# Patient Record
Sex: Female | Born: 1945 | Race: White | Hispanic: No | Smoking: Never smoker
Health system: Southern US, Community
[De-identification: ages and names within clinical notes are randomized; demographics above are authoritative.]

## PROBLEM LIST (undated history)

## (undated) DIAGNOSIS — D1720 Benign lipomatous neoplasm of skin and subcutaneous tissue of unspecified limb: Secondary | ICD-10-CM

## (undated) DIAGNOSIS — R519 Headache, unspecified: Secondary | ICD-10-CM

## (undated) DIAGNOSIS — Z9889 Other specified postprocedural states: Secondary | ICD-10-CM

## (undated) DIAGNOSIS — M519 Unspecified thoracic, thoracolumbar and lumbosacral intervertebral disc disorder: Secondary | ICD-10-CM

## (undated) DIAGNOSIS — B029 Zoster without complications: Secondary | ICD-10-CM

## (undated) DIAGNOSIS — R51 Headache: Secondary | ICD-10-CM

## (undated) DIAGNOSIS — I10 Essential (primary) hypertension: Secondary | ICD-10-CM

## (undated) DIAGNOSIS — R112 Nausea with vomiting, unspecified: Secondary | ICD-10-CM

## (undated) HISTORY — PX: BUNIONECTOMY: SHX129

## (undated) HISTORY — PX: OTHER SURGICAL HISTORY: SHX169

## (undated) HISTORY — PX: COLONOSCOPY: SHX174

---

## 2005-01-01 ENCOUNTER — Ambulatory Visit: Payer: Self-pay | Admitting: Obstetrics and Gynecology

## 2005-07-07 ENCOUNTER — Ambulatory Visit: Payer: Self-pay | Admitting: Unknown Physician Specialty

## 2006-01-13 ENCOUNTER — Ambulatory Visit: Payer: Self-pay | Admitting: Obstetrics and Gynecology

## 2006-05-16 ENCOUNTER — Emergency Department: Payer: Self-pay | Admitting: Emergency Medicine

## 2007-02-06 ENCOUNTER — Emergency Department: Payer: Self-pay | Admitting: Emergency Medicine

## 2007-02-06 ENCOUNTER — Other Ambulatory Visit: Payer: Self-pay

## 2007-02-08 ENCOUNTER — Ambulatory Visit: Payer: Self-pay | Admitting: Family Medicine

## 2007-03-23 ENCOUNTER — Ambulatory Visit: Payer: Self-pay | Admitting: Obstetrics and Gynecology

## 2008-04-06 ENCOUNTER — Ambulatory Visit: Payer: Self-pay | Admitting: Obstetrics and Gynecology

## 2008-06-22 ENCOUNTER — Ambulatory Visit: Payer: Self-pay | Admitting: Internal Medicine

## 2009-05-02 ENCOUNTER — Ambulatory Visit: Payer: Self-pay | Admitting: Obstetrics and Gynecology

## 2010-05-13 ENCOUNTER — Ambulatory Visit: Payer: Self-pay | Admitting: Obstetrics and Gynecology

## 2011-05-16 ENCOUNTER — Ambulatory Visit: Payer: Self-pay | Admitting: Obstetrics and Gynecology

## 2011-09-15 ENCOUNTER — Emergency Department: Payer: Self-pay | Admitting: Unknown Physician Specialty

## 2012-05-17 ENCOUNTER — Ambulatory Visit: Payer: Self-pay | Admitting: Obstetrics and Gynecology

## 2013-05-18 ENCOUNTER — Ambulatory Visit: Payer: Self-pay | Admitting: Obstetrics and Gynecology

## 2013-10-17 DIAGNOSIS — L570 Actinic keratosis: Secondary | ICD-10-CM

## 2013-10-17 HISTORY — DX: Actinic keratosis: L57.0

## 2014-05-23 ENCOUNTER — Ambulatory Visit: Payer: Self-pay

## 2014-09-20 ENCOUNTER — Ambulatory Visit: Payer: Self-pay

## 2014-11-20 ENCOUNTER — Ambulatory Visit: Payer: Self-pay | Admitting: Surgery

## 2014-11-20 DIAGNOSIS — I10 Essential (primary) hypertension: Secondary | ICD-10-CM

## 2014-11-20 LAB — CBC WITH DIFFERENTIAL/PLATELET
Basophil #: 0 10*3/uL (ref 0.0–0.1)
Basophil %: 0.4 %
EOS PCT: 1.1 %
Eosinophil #: 0.1 10*3/uL (ref 0.0–0.7)
HCT: 44 % (ref 35.0–47.0)
HGB: 14.1 g/dL (ref 12.0–16.0)
LYMPHS ABS: 1.4 10*3/uL (ref 1.0–3.6)
LYMPHS PCT: 20.4 %
MCH: 29.5 pg (ref 26.0–34.0)
MCHC: 32.1 g/dL (ref 32.0–36.0)
MCV: 92 fL (ref 80–100)
Monocyte #: 0.5 x10 3/mm (ref 0.2–0.9)
Monocyte %: 7.2 %
Neutrophil #: 4.7 10*3/uL (ref 1.4–6.5)
Neutrophil %: 70.9 %
PLATELETS: 239 10*3/uL (ref 150–440)
RBC: 4.79 10*6/uL (ref 3.80–5.20)
RDW: 14 % (ref 11.5–14.5)
WBC: 6.7 10*3/uL (ref 3.6–11.0)

## 2014-11-20 LAB — COMPREHENSIVE METABOLIC PANEL
ALBUMIN: 4 g/dL (ref 3.4–5.0)
ALK PHOS: 103 U/L
ANION GAP: 5 — AB (ref 7–16)
BILIRUBIN TOTAL: 0.5 mg/dL (ref 0.2–1.0)
BUN: 17 mg/dL (ref 7–18)
CREATININE: 1.05 mg/dL (ref 0.60–1.30)
Calcium, Total: 8.9 mg/dL (ref 8.5–10.1)
Chloride: 105 mmol/L (ref 98–107)
Co2: 28 mmol/L (ref 21–32)
EGFR (African American): >60
EGFR (Non-African Amer.): 56 — ABNORMAL LOW
Glucose: 99 mg/dL (ref 65–99)
OSMOLALITY: 277 (ref 275–301)
POTASSIUM: 3.9 mmol/L (ref 3.5–5.1)
SGOT(AST): 25 U/L (ref 15–37)
SGPT (ALT): 37 U/L
Sodium: 138 mmol/L (ref 136–145)
Total Protein: 7.3 g/dL (ref 6.4–8.2)

## 2014-11-28 ENCOUNTER — Ambulatory Visit: Payer: Self-pay | Admitting: Surgery

## 2015-03-12 LAB — SURGICAL PATHOLOGY

## 2015-03-18 NOTE — Op Note (Signed)
PATIENT NAME:  Ashley Parrish, Ashley Parrish MR#:  [id number] DATE OF BIRTH:  01/25/1946  DATE OF PROCEDURE:  11/28/2014  PREOPERATIVE DIAGNOSIS: Intramuscular lipoma of left thigh.   POSTOPERATIVE DIAGNOSIS: Intramuscular lipoma of left thigh.   PROCEDURE: Excision of mass of left thigh.   SURGEON: Rochel Brome, MD  ANESTHESIA: General.   INDICATIONS: This 69 year old female has approximately a 7 year history of a mass of the left thigh, which has gradually been increasing in size. Recent MRI demonstrated increased size. It was intramuscular and palpable and surgery was recommended for definitive treatment.   DESCRIPTION OF PROCEDURE: The patient was placed on the operating table in the supine position under general anesthesia. The left leg was externally rotated and flexed slightly at the hip and knee and padded with a pillow. The palpable mass of the upper medial aspect of the thigh had been marked with a surgical marker. The site was prepared with ChloraPrep and draped in a sterile manner.   A longitudinally oriented incision was made in the medial aspect of the left upper thigh some 7 cm in length, carried down through subcutaneous tissues. Numerous small bleeding points were cauterized. Superficial fascia was incised. Dissection was carried down to the deep fascia which was incised longitudinally along the course of the incision and demonstrated an intramuscular lipoma. This did have a smooth plane of dissection and was dissected with blunt and sharp dissection. There were a number of vascular pedicles which were suture ligated with 3-0 Vicryl and with somewhat tedious dissection the mass was completely excised. The ex vivo measurement was 7 x 4 x 1.5 cm and was placed in formalin for routine pathology. The wound was inspected. One additional bleeding point was suture ligated with 3-0 Vicryl. Several small bleeding points were cauterized. Hemostasis was subsequently intact. Next, the deep fascia was closed  with interrupted 2-0 Vicryl sutures. Superficial fascia was closed with interrupted 2-0 Vicryl. The subcutaneous tissues were infiltrated with 0.5% Sensorcaine with epinephrine. The skin was closed with a running 4-0 Monocryl subcuticular suture and LiquiBand.  The patient appeared to tolerate the procedure satisfactorily and was prepared for transfer to the recovery room. ____________________________ Lenna Sciara. Rochel Brome, MD jws:sb D: 11/28/2014 10:22:44 ET T: 11/28/2014 10:29:11 ET JOB#: [id number]  cc: Loreli Dollar, MD, <Dictator> Loreli Dollar MD ELECTRONICALLY SIGNED 12/02/2014 10:42

## 2015-06-05 ENCOUNTER — Other Ambulatory Visit: Payer: Self-pay | Admitting: Obstetrics and Gynecology

## 2015-06-05 DIAGNOSIS — Z1231 Encounter for screening mammogram for malignant neoplasm of breast: Secondary | ICD-10-CM

## 2015-06-08 ENCOUNTER — Other Ambulatory Visit: Payer: Self-pay | Admitting: Obstetrics and Gynecology

## 2015-06-08 ENCOUNTER — Ambulatory Visit
Admission: RE | Admit: 2015-06-08 | Discharge: 2015-06-08 | Disposition: A | Discharge status: Home / Self Care | Payer: Medicare Other | Source: Ambulatory Visit | Attending: Obstetrics and Gynecology | Admitting: Obstetrics and Gynecology

## 2015-06-08 DIAGNOSIS — Z1231 Encounter for screening mammogram for malignant neoplasm of breast: Secondary | ICD-10-CM | POA: Diagnosis not present

## 2015-06-22 ENCOUNTER — Encounter: Payer: Self-pay | Admitting: *Deleted

## 2015-06-25 ENCOUNTER — Encounter: Payer: Self-pay | Admitting: Anesthesiology

## 2015-06-25 ENCOUNTER — Ambulatory Visit: Payer: Medicare Other | Admitting: Anesthesiology

## 2015-06-25 ENCOUNTER — Encounter
Admission: RE | Disposition: A | Discharge status: Home / Self Care | Payer: Self-pay | Source: Ambulatory Visit | Attending: Unknown Physician Specialty

## 2015-06-25 ENCOUNTER — Ambulatory Visit
Admission: RE | Admit: 2015-06-25 | Discharge: 2015-06-25 | Disposition: A | Discharge status: Home / Self Care | Payer: Medicare Other | Source: Ambulatory Visit | Attending: Unknown Physician Specialty | Admitting: Unknown Physician Specialty

## 2015-06-25 DIAGNOSIS — I2510 Atherosclerotic heart disease of native coronary artery without angina pectoris: Secondary | ICD-10-CM | POA: Diagnosis not present

## 2015-06-25 DIAGNOSIS — Z1211 Encounter for screening for malignant neoplasm of colon: Secondary | ICD-10-CM | POA: Insufficient documentation

## 2015-06-25 DIAGNOSIS — I252 Old myocardial infarction: Secondary | ICD-10-CM | POA: Insufficient documentation

## 2015-06-25 DIAGNOSIS — D123 Benign neoplasm of transverse colon: Secondary | ICD-10-CM | POA: Diagnosis not present

## 2015-06-25 DIAGNOSIS — K648 Other hemorrhoids: Secondary | ICD-10-CM | POA: Insufficient documentation

## 2015-06-25 DIAGNOSIS — I10 Essential (primary) hypertension: Secondary | ICD-10-CM | POA: Diagnosis not present

## 2015-06-25 HISTORY — DX: Benign lipomatous neoplasm of skin and subcutaneous tissue of unspecified limb: D17.20

## 2015-06-25 HISTORY — DX: Unspecified thoracic, thoracolumbar and lumbosacral intervertebral disc disorder: M51.9

## 2015-06-25 HISTORY — DX: Nausea with vomiting, unspecified: R11.2

## 2015-06-25 HISTORY — DX: Other specified postprocedural states: Z98.890

## 2015-06-25 HISTORY — DX: Headache: R51

## 2015-06-25 HISTORY — DX: Headache, unspecified: R51.9

## 2015-06-25 HISTORY — PX: COLONOSCOPY WITH PROPOFOL: SHX5780

## 2015-06-25 HISTORY — DX: Essential (primary) hypertension: I10

## 2015-06-25 HISTORY — DX: Zoster without complications: B02.9

## 2015-06-25 SURGERY — COLONOSCOPY WITH PROPOFOL
Anesthesia: General

## 2015-06-25 MED ORDER — PROPOFOL INFUSION 10 MG/ML OPTIME
INTRAVENOUS | Status: DC | PRN
  Administered 2015-06-25: 100 ug/kg/min via INTRAVENOUS

## 2015-06-25 MED ORDER — EPHEDRINE SULFATE 50 MG/ML IJ SOLN
INTRAMUSCULAR | Status: DC | PRN
  Administered 2015-06-25: 10 mg via INTRAVENOUS

## 2015-06-25 MED ORDER — ONDANSETRON HCL 4 MG/2ML IJ SOLN
INTRAMUSCULAR | Status: DC | PRN
  Administered 2015-06-25: 4 mg via INTRAVENOUS

## 2015-06-25 MED ORDER — SODIUM CHLORIDE 0.9 % IV SOLN
INTRAVENOUS | Status: DC
  Administered 2015-06-25: 1000 mL via INTRAVENOUS

## 2015-06-25 MED ORDER — GLYCOPYRROLATE 0.2 MG/ML IJ SOLN
INTRAMUSCULAR | Status: DC | PRN
  Administered 2015-06-25: 0.2 mg via INTRAVENOUS

## 2015-06-25 MED ORDER — FENTANYL CITRATE (PF) 100 MCG/2ML IJ SOLN
INTRAMUSCULAR | Status: DC | PRN
  Administered 2015-06-25: 50 ug via INTRAVENOUS

## 2015-06-25 MED ORDER — PROPOFOL 10 MG/ML IV BOLUS
INTRAVENOUS | Status: DC | PRN
  Administered 2015-06-25: 10 mg via INTRAVENOUS
  Administered 2015-06-25: 50 mg via INTRAVENOUS

## 2015-06-25 MED ORDER — SODIUM CHLORIDE 0.9 % IV SOLN: INTRAVENOUS | Status: DC

## 2015-06-25 MED ORDER — LIDOCAINE HCL (PF) 2 % IJ SOLN
INTRAMUSCULAR | Status: DC | PRN
  Administered 2015-06-25: 50 mg

## 2015-06-25 NOTE — Anesthesia Postprocedure Evaluation (Signed)
  Anesthesia Post-op Note  Patient: Ashley Parrish  Procedure(s) Performed: Procedure(s): COLONOSCOPY WITH PROPOFOL (N/A)  Anesthesia type:General  Patient location: PACU  Post pain: Pain level controlled  Post assessment: Post-op Vital signs reviewed, Patient's Cardiovascular Status Stable, Respiratory Function Stable, Patent Airway and No signs of Nausea or vomiting  Post vital signs: Reviewed and stable  Last Vitals:  Filed Vitals:   06/25/15 1115  BP: 127/71  Pulse: 67  Temp:   Resp: 19    Level of consciousness: awake, alert  and patient cooperative  Complications: No apparent anesthesia complications

## 2015-06-25 NOTE — H&P (Signed)
Primary Care Physician:  Adrian Prows, MD Primary Gastroenterologist:  Dr. Vira Agar  Pre-Procedure History & Physical: HPI:  Ashley Parrish is a 69 y.o. female is here for an colonoscopy.   Past Medical History  Diagnosis Date  . PONV (postoperative nausea and vomiting)   . Hypertension   . Headache   . Shingles   . Lipoma of thigh     left  . Disc disorder     Past Surgical History  Procedure Laterality Date  . Colonoscopy    . Bunionectomy    . Excision of lipoma      Prior to Admission medications   Medication Sig Start Date End Date Taking? Authorizing Provider  amLODipine (NORVASC) 5 MG tablet Take 5 mg by mouth daily.   Yes Historical Provider, MD  bisoprolol (ZEBETA) 5 MG tablet Take 5 mg by mouth daily.   Yes Historical Provider, MD  bisoprolol-hydrochlorothiazide (ZIAC) 2.5-6.25 MG per tablet Take 1 tablet by mouth daily.   Yes Historical Provider, MD  calcium elemental as carbonate (BARIATRIC TUMS ULTRA) 400 MG tablet Chew 1,000 mg by mouth 3 (three) times daily.   Yes Historical Provider, MD  docusate sodium (COLACE) 250 MG capsule Take 250 mg by mouth daily.   Yes Historical Provider, MD  meclizine (ANTIVERT) 25 MG tablet Take 25 mg by mouth 2 (two) times daily as needed for dizziness.   Yes Historical Provider, MD  omega-3 acid ethyl esters (LOVAZA) 1 G capsule Take by mouth daily.   Yes Historical Provider, MD    Allergies as of 05/17/2015  . (Not on File)    Family History  Problem Relation Age of Onset  . Breast cancer Paternal Aunt 54    History   Social History  . Marital Status: Divorced    Spouse Name: N/A  . Number of Children: N/A  . Years of Education: N/A   Occupational History  . Not on file.   Social History Main Topics  . Smoking status: Never Smoker   . Smokeless tobacco: Never Used  . Alcohol Use: No  . Drug Use: No  . Sexual Activity: Not on file   Other Topics Concern  . Not on file   Social History Narrative     Review of Systems: See HPI, otherwise negative ROS  Physical Exam: BP 125/68 mmHg  Pulse 67  Temp(Src) 97.8 F (36.6 C) (Tympanic)  Resp 16  Ht 5\' 8"  (1.727 m)  Wt 81.647 kg (180 lb)  BMI 27.38 kg/m2  SpO2 99% General:   Alert,  pleasant and cooperative in NAD Head:  Normocephalic and atraumatic. Neck:  Supple; no masses or thyromegaly. Lungs:  Clear throughout to auscultation.    Heart:  Regular rate and rhythm. Abdomen:  Soft, nontender and nondistended. Normal bowel sounds, without guarding, and without rebound.   Neurologic:  Alert and  oriented x4;  grossly normal neurologically.  Impression/Plan: [name] is here for an colonoscopy to be performed for screening colonoscopy  Risks, benefits, limitations, and alternatives regarding  colonoscopy have been reviewed with the patient.  Questions have been answered.  All parties agreeable.   Gaylyn Cheers, MD  06/25/2015, 10:06 AM   Primary Care Physician:  Adrian Prows, MD Primary Gastroenterologist:  Dr. Vira Agar  Pre-Procedure History & Physical: HPI:  Ashley Parrish is a 69 y.o. female is here for an colonoscopy.   Past Medical History  Diagnosis Date  . PONV (postoperative nausea and vomiting)   .  Hypertension   . Headache   . Shingles   . Lipoma of thigh     left  . Disc disorder     Past Surgical History  Procedure Laterality Date  . Colonoscopy    . Bunionectomy    . Excision of lipoma      Prior to Admission medications   Medication Sig Start Date End Date Taking? Authorizing Provider  amLODipine (NORVASC) 5 MG tablet Take 5 mg by mouth daily.   Yes Historical Provider, MD  bisoprolol (ZEBETA) 5 MG tablet Take 5 mg by mouth daily.   Yes Historical Provider, MD  bisoprolol-hydrochlorothiazide (ZIAC) 2.5-6.25 MG per tablet Take 1 tablet by mouth daily.   Yes Historical Provider, MD  calcium elemental as carbonate (BARIATRIC TUMS ULTRA) 400 MG tablet Chew 1,000 mg by mouth 3 (three)  times daily.   Yes Historical Provider, MD  docusate sodium (COLACE) 250 MG capsule Take 250 mg by mouth daily.   Yes Historical Provider, MD  meclizine (ANTIVERT) 25 MG tablet Take 25 mg by mouth 2 (two) times daily as needed for dizziness.   Yes Historical Provider, MD  omega-3 acid ethyl esters (LOVAZA) 1 G capsule Take by mouth daily.   Yes Historical Provider, MD    Allergies as of 05/17/2015  . (Not on File)    Family History  Problem Relation Age of Onset  . Breast cancer Paternal Aunt 9    History   Social History  . Marital Status: Divorced    Spouse Name: N/A  . Number of Children: N/A  . Years of Education: N/A   Occupational History  . Not on file.   Social History Main Topics  . Smoking status: Never Smoker   . Smokeless tobacco: Never Used  . Alcohol Use: No  . Drug Use: No  . Sexual Activity: Not on file   Other Topics Concern  . Not on file   Social History Narrative    Review of Systems: See HPI, otherwise negative ROS  Physical Exam: BP 125/68 mmHg  Pulse 67  Temp(Src) 97.8 F (36.6 C) (Tympanic)  Resp 16  Ht 5\' 8"  (1.727 m)  Wt 81.647 kg (180 lb)  BMI 27.38 kg/m2  SpO2 99% General:   Alert,  pleasant and cooperative in NAD Head:  Normocephalic and atraumatic. Neck:  Supple; no masses or thyromegaly. Lungs:  Clear throughout to auscultation.    Heart:  Regular rate and rhythm. Abdomen:  Soft, nontender and nondistended. Normal bowel sounds, without guarding, and without rebound.   Neurologic:  Alert and  oriented x4;  grossly normal neurologically.  Impression/Plan: Ashley Parrish is here for an colonoscopy to be performed for screening colonoscopy  Risks, benefits, limitations, and alternatives regarding  colonoscopy have been reviewed with the patient.  Questions have been answered.  All parties agreeable.   Gaylyn Cheers, MD  06/25/2015, 10:06 AM

## 2015-06-25 NOTE — Op Note (Signed)
[facility] Gastroenterology Patient Name: Ashley Parrish Procedure Date: 06/25/2015 10:00 AM MRN: [id number] Account #: [id number] Date of Birth: Jan 23, 1946 Admit Type: Outpatient Age: 69 Room: [facility] ENDO ROOM 1 Gender: Female Note Status: Finalized Procedure:         Colonoscopy Indications:       Screening for colorectal malignant neoplasm Providers:         Manya Silvas, MD Referring MD:      Youlanda Roys. Ola Spurr, MD (Referring MD) Medicines:         Propofol per Anesthesia Complications:     No immediate complications. Procedure:         Pre-Anesthesia Assessment:                    - After reviewing the risks and benefits, the patient was                     deemed in satisfactory condition to undergo the procedure.                    After obtaining informed consent, the colonoscope was                     passed under direct vision. Throughout the procedure, the                     patient's blood pressure, pulse, and oxygen saturations                     were monitored continuously. The Colonoscope was                     introduced through the anus and advanced to the the cecum,                     identified by appendiceal orifice and ileocecal valve. The                     colonoscopy was performed without difficulty. The patient                     tolerated the procedure well. The quality of the bowel                     preparation was good. Findings:      A diminutive polyp was found at the splenic flexure. The polyp was       sessile. The polyp was removed with a jumbo cold forceps. Resection and       retrieval were complete.      Internal hemorrhoids were found during endoscopy. The hemorrhoids were       small and Grade I (internal hemorrhoids that do not prolapse).      The exam was otherwise without abnormality. Impression:        - One diminutive polyp at the splenic flexure. Resected                     and retrieved.   - Internal hemorrhoids.                    - The examination was otherwise normal. Recommendation:    - Await pathology results. Manya Silvas, MD 06/25/2015 10:38:10 AM This report has been signed electronically. Number of Addenda: 0 Note Initiated  On: 06/25/2015 10:00 AM Scope Withdrawal Time: 0 hours 13 minutes 13 seconds  Total Procedure Duration: 0 hours 18 minutes 40 seconds       [facility]

## 2015-06-25 NOTE — Anesthesia Preprocedure Evaluation (Signed)
Anesthesia Evaluation  Patient identified by MRN, date of birth, ID band Patient awake    Reviewed: Allergy & Precautions, H&P , NPO status , Patient's Chart, lab work & pertinent test results, reviewed documented beta blocker date and time   History of Anesthesia Complications (+) PONV and history of anesthetic complications  Airway Mallampati: II  TM Distance: >3 FB Neck ROM: full    Dental no notable dental hx.    Pulmonary neg pulmonary ROS,  breath sounds clear to auscultation  Pulmonary exam normal       Cardiovascular Exercise Tolerance: Good hypertension, On Home Beta Blockers and On Medications - CAD, - Past MI and - CABG Normal cardiovascular exam- Valvular Problems/MurmursRhythm:regular Rate:Normal     Neuro/Psych  Headaches, negative psych ROS   GI/Hepatic negative GI ROS, Neg liver ROS,   Endo/Other  negative endocrine ROS  Renal/GU negative Renal ROS  negative genitourinary   Musculoskeletal   Abdominal   Peds  Hematology negative hematology ROS (+)   Anesthesia Other Findings Past Medical History:   PONV (postoperative nausea and vomiting)                     Hypertension                                                 Headache                                                     Shingles                                                     Lipoma of thigh                                                Comment:left   Disc disorder                                                Reproductive/Obstetrics negative OB ROS                             Anesthesia Physical Anesthesia Plan  ASA: II  Anesthesia Plan: General   Post-op Pain Management:    Induction:   Airway Management Planned:   Additional Equipment:   Intra-op Plan:   Post-operative Plan:   Informed Consent: I have reviewed the patients History and Physical, chart, labs and discussed the procedure  including the risks, benefits and alternatives for the proposed anesthesia with the patient or authorized representative who has indicated his/her understanding and acceptance.   Dental Advisory Given  Plan Discussed with: Anesthesiologist, CRNA and Surgeon  Anesthesia Plan Comments:  Anesthesia Quick Evaluation  

## 2015-06-25 NOTE — Transfer of Care (Signed)
Immediate Anesthesia Transfer of Care Note  Patient: Ashley Parrish  Procedure(s) Performed: Procedure(s): COLONOSCOPY WITH PROPOFOL (N/A)  Patient Location: PACU  Anesthesia Type:General  Level of Consciousness: sedated  Airway & Oxygen Therapy: Patient Spontanous Breathing and Patient connected to nasal cannula oxygen  Post-op Assessment: Report given to RN and Post -op Vital signs reviewed and stable  Post vital signs: Reviewed and stable  Last Vitals:  Filed Vitals:   06/25/15 0913  BP: 125/68  Pulse: 67  Temp: 36.6 C  Resp: 16    Complications: No apparent anesthesia complications

## 2015-06-26 ENCOUNTER — Encounter: Payer: Self-pay | Admitting: Unknown Physician Specialty

## 2015-06-26 LAB — SURGICAL PATHOLOGY

## 2016-06-12 ENCOUNTER — Other Ambulatory Visit: Payer: Self-pay | Admitting: Obstetrics and Gynecology

## 2016-06-12 DIAGNOSIS — Z1231 Encounter for screening mammogram for malignant neoplasm of breast: Secondary | ICD-10-CM

## 2016-07-02 ENCOUNTER — Ambulatory Visit
Admission: RE | Admit: 2016-07-02 | Discharge: 2016-07-02 | Disposition: A | Discharge status: Home / Self Care | Payer: Medicare Other | Source: Ambulatory Visit | Attending: Obstetrics and Gynecology | Admitting: Obstetrics and Gynecology

## 2016-07-02 ENCOUNTER — Other Ambulatory Visit: Payer: Self-pay | Admitting: Obstetrics and Gynecology

## 2016-07-02 DIAGNOSIS — Z1231 Encounter for screening mammogram for malignant neoplasm of breast: Secondary | ICD-10-CM

## 2017-03-17 ENCOUNTER — Other Ambulatory Visit: Payer: Self-pay | Admitting: Physician Assistant

## 2017-03-17 DIAGNOSIS — M12811 Other specific arthropathies, not elsewhere classified, right shoulder: Secondary | ICD-10-CM

## 2017-03-30 ENCOUNTER — Ambulatory Visit
Admission: RE | Admit: 2017-03-30 | Discharge: 2017-03-30 | Disposition: A | Discharge status: Home / Self Care | Payer: Medicare Other | Source: Ambulatory Visit | Attending: Physician Assistant | Admitting: Physician Assistant

## 2017-03-30 DIAGNOSIS — M85411 Solitary bone cyst, right shoulder: Secondary | ICD-10-CM | POA: Insufficient documentation

## 2017-03-30 DIAGNOSIS — M12811 Other specific arthropathies, not elsewhere classified, right shoulder: Secondary | ICD-10-CM

## 2017-03-30 DIAGNOSIS — M75111 Incomplete rotator cuff tear or rupture of right shoulder, not specified as traumatic: Secondary | ICD-10-CM | POA: Diagnosis not present

## 2017-06-01 ENCOUNTER — Other Ambulatory Visit: Payer: Self-pay | Admitting: Infectious Diseases

## 2017-06-01 DIAGNOSIS — Z1231 Encounter for screening mammogram for malignant neoplasm of breast: Secondary | ICD-10-CM

## 2017-07-03 ENCOUNTER — Ambulatory Visit
Admission: RE | Admit: 2017-07-03 | Discharge: 2017-07-03 | Disposition: A | Discharge status: Home / Self Care | Payer: Medicare Other | Source: Ambulatory Visit | Attending: Infectious Diseases | Admitting: Infectious Diseases

## 2017-07-03 DIAGNOSIS — Z1231 Encounter for screening mammogram for malignant neoplasm of breast: Secondary | ICD-10-CM | POA: Insufficient documentation

## 2017-09-10 ENCOUNTER — Ambulatory Visit
Admission: RE | Admit: 2017-09-10 | Discharge: 2017-09-10 | Disposition: A | Discharge status: Home / Self Care | Payer: Medicare Other | Source: Ambulatory Visit | Attending: Physician Assistant | Admitting: Physician Assistant

## 2017-09-10 ENCOUNTER — Other Ambulatory Visit: Payer: Self-pay | Admitting: Physician Assistant

## 2017-09-10 DIAGNOSIS — R918 Other nonspecific abnormal finding of lung field: Secondary | ICD-10-CM | POA: Diagnosis not present

## 2017-09-10 DIAGNOSIS — R0789 Other chest pain: Secondary | ICD-10-CM | POA: Diagnosis not present

## 2017-09-10 LAB — POCT I-STAT CREATININE: Creatinine, Ser: 1 mg/dL (ref 0.44–1.00)

## 2017-09-10 MED ORDER — IOPAMIDOL (ISOVUE-370) INJECTION 76%
75.0000 mL | Freq: Once | INTRAVENOUS | Status: AC | PRN
  Administered 2017-09-10: 75 mL via INTRAVENOUS

## 2017-09-25 ENCOUNTER — Other Ambulatory Visit
Admission: RE | Admit: 2017-09-25 | Discharge: 2017-09-25 | Disposition: A | Discharge status: Home / Self Care | Payer: Medicare Other | Source: Ambulatory Visit | Attending: Infectious Diseases | Admitting: Infectious Diseases

## 2017-09-25 DIAGNOSIS — I10 Essential (primary) hypertension: Secondary | ICD-10-CM | POA: Diagnosis present

## 2017-09-25 LAB — TROPONIN I: Troponin I: <0.03 ng/mL (ref <0.03)

## 2017-09-28 ENCOUNTER — Other Ambulatory Visit: Payer: Self-pay | Admitting: Nurse Practitioner

## 2017-09-28 DIAGNOSIS — R1013 Epigastric pain: Secondary | ICD-10-CM

## 2017-09-28 DIAGNOSIS — R1084 Generalized abdominal pain: Secondary | ICD-10-CM

## 2017-10-09 ENCOUNTER — Ambulatory Visit
Admission: RE | Admit: 2017-10-09 | Discharge: 2017-10-09 | Disposition: A | Discharge status: Home / Self Care | Payer: Medicare Other | Source: Ambulatory Visit | Attending: Nurse Practitioner | Admitting: Nurse Practitioner

## 2017-10-09 DIAGNOSIS — R1084 Generalized abdominal pain: Secondary | ICD-10-CM | POA: Insufficient documentation

## 2017-10-09 DIAGNOSIS — R198 Other specified symptoms and signs involving the digestive system and abdomen: Secondary | ICD-10-CM | POA: Diagnosis not present

## 2017-10-09 DIAGNOSIS — N271 Small kidney, bilateral: Secondary | ICD-10-CM | POA: Insufficient documentation

## 2017-10-09 DIAGNOSIS — R1013 Epigastric pain: Secondary | ICD-10-CM | POA: Insufficient documentation

## 2017-10-13 ENCOUNTER — Ambulatory Visit: Payer: Medicare Other

## 2017-10-21 ENCOUNTER — Other Ambulatory Visit: Payer: Self-pay | Admitting: Nurse Practitioner

## 2017-10-21 DIAGNOSIS — R1084 Generalized abdominal pain: Secondary | ICD-10-CM

## 2017-10-26 ENCOUNTER — Ambulatory Visit
Admission: RE | Admit: 2017-10-26 | Payer: Medicare Other | Source: Ambulatory Visit | Admitting: Unknown Physician Specialty

## 2017-10-26 ENCOUNTER — Encounter: Admission: RE | Payer: Self-pay | Source: Ambulatory Visit

## 2017-10-26 SURGERY — ESOPHAGOGASTRODUODENOSCOPY (EGD) WITH PROPOFOL
Anesthesia: General

## 2017-10-26 SURGERY — EGD (ESOPHAGOGASTRODUODENOSCOPY)
Anesthesia: General

## 2017-11-04 ENCOUNTER — Ambulatory Visit: Payer: Medicare Other

## 2017-11-11 ENCOUNTER — Ambulatory Visit
Admission: RE | Admit: 2017-11-11 | Discharge: 2017-11-11 | Disposition: A | Discharge status: Home / Self Care | Payer: Medicare Other | Source: Ambulatory Visit | Attending: Nurse Practitioner | Admitting: Nurse Practitioner

## 2017-11-11 DIAGNOSIS — R1084 Generalized abdominal pain: Secondary | ICD-10-CM | POA: Diagnosis present

## 2017-11-11 MED ORDER — TECHNETIUM TC 99M MEBROFENIN IV KIT
5.3640 | PACK | Freq: Once | INTRAVENOUS | Status: AC | PRN
  Administered 2017-11-11: 5.364 via INTRAVENOUS

## 2017-11-25 ENCOUNTER — Other Ambulatory Visit: Payer: Self-pay | Admitting: Obstetrics and Gynecology

## 2017-11-25 DIAGNOSIS — Z1231 Encounter for screening mammogram for malignant neoplasm of breast: Secondary | ICD-10-CM

## 2018-01-19 ENCOUNTER — Encounter: Payer: Self-pay | Admitting: Urology

## 2018-01-19 ENCOUNTER — Ambulatory Visit: Payer: Medicare Other | Admitting: Urology

## 2018-01-19 DIAGNOSIS — N393 Stress incontinence (female) (male): Secondary | ICD-10-CM

## 2018-01-19 DIAGNOSIS — R350 Frequency of micturition: Secondary | ICD-10-CM

## 2018-01-19 LAB — URINALYSIS, COMPLETE
Bilirubin, UA: NEGATIVE
GLUCOSE, UA: NEGATIVE
Ketones, UA: NEGATIVE
Leukocytes, UA: NEGATIVE
NITRITE UA: NEGATIVE
PH UA: 6.5 (ref 5.0–7.5)
PROTEIN UA: NEGATIVE
Specific Gravity, UA: <1.005 — ABNORMAL LOW (ref 1.005–1.030)
UUROB: 0.2 mg/dL (ref 0.2–1.0)

## 2018-01-19 LAB — MICROSCOPIC EXAMINATION
Bacteria, UA: NONE SEEN
Epithelial Cells (non renal): NONE SEEN /hpf (ref 0–10)
RBC, UA: NONE SEEN /hpf (ref 0–?)

## 2018-01-19 LAB — BLADDER SCAN AMB NON-IMAGING

## 2018-01-19 NOTE — Progress Notes (Signed)
01/19/2018 7:53 PM   Ashley Parrish Aug 05, 1946 [id number]  Referring provider: Leonel Ramsay, MD [address] [place], [place] [address]  Chief Complaint  Patient presents with  . Urinary Frequency    New Patient    HPI: 72 year old female referred for further evaluation of urinary frequency and urgency.    She has been seen and evaluated for this on several occasions since Feb 1st 2019.  Each time, her UA is unremarkable and there is no evidence of urinary tract infection.  Her symptoms are relatively acute in onset and relatively intense for several weeks.  She also had associated nocturia getting up at least once an hour.  She denies any associated dysuria or gross hematuria.  No vaginal bulging.  She doesOver the past week, her symptoms are improved dramatically but has not completely resolved.    She does have baseline stress urinary incontinence with laughing, coughing, sneezing, mostly associated with colds.  She is not bothered by this.  He does have a personal history of UTIs which are infrequent.  This are often associated with gross hematuria and dysuria (remote history).  She has had no significant changes in her medication.  She recently resumed fish oil as well as rice yeast rice extract.  Since resuming this medication, her stools are softer and more well formed, previously had a propensity for constipation.  She does drink 3-4 cups of coffee a day in the morning.  She is also been trying to drink more water as part of her weight loss/exercise routine.  No personal history of DM.    UA today is unremarkable.    No personal history of smoking.  No chemical exposure.  No gross hematuria.    PVR today 36 cc  RUS on 10/09/17 show slightly atrophic kidney bilaterally.     PMH: Past Medical History:  Diagnosis Date  . Disc disorder   . Headache   . Hypertension   . Lipoma of thigh    left  . PONV (postoperative nausea and vomiting)   .  Shingles     Surgical History: Past Surgical History:  Procedure Laterality Date  . BUNIONECTOMY    . COLONOSCOPY    . COLONOSCOPY WITH PROPOFOL N/A 06/25/2015   Procedure: COLONOSCOPY WITH PROPOFOL;  Surgeon: Manya Silvas, MD;  Location: [facility] ENDOSCOPY;  Service: Endoscopy;  Laterality: N/A;  . excision of lipoma      Home Medications:  Allergies as of 01/19/2018      Reactions   Penicillins Nausea Only      Medication List        Accurate as of 01/19/18  7:53 PM. Always use your most recent med list.          amLODipine 5 MG tablet Commonly known as:  NORVASC Take 5 mg by mouth daily.   bisoprolol 5 MG tablet Commonly known as:  ZEBETA Take 5 mg by mouth daily.   bisoprolol-hydrochlorothiazide 2.5-6.25 MG tablet Commonly known as:  ZIAC Take 1 tablet by mouth daily.   calcium elemental as carbonate 400 MG chewable tablet Commonly known as:  BARIATRIC TUMS ULTRA Chew 1,000 mg by mouth 3 (three) times daily.   docusate sodium 50 MG capsule Commonly known as:  COLACE Take 50 mg by mouth 2 (two) times daily.   meclizine 25 MG tablet Commonly known as:  ANTIVERT Take 25 mg by mouth 2 (two) times daily as needed for dizziness.   omega-3 acid ethyl esters  1 g capsule Commonly known as:  LOVAZA Take by mouth daily.   omeprazole 20 MG capsule Commonly known as:  PRILOSEC Take 20 mg by mouth daily.   RED YEAST RICE PO Take by mouth.       Allergies:  Allergies  Allergen Reactions  . Penicillins Nausea Only    Family History: Family History  Problem Relation Age of Onset  . Breast cancer Paternal Aunt 23  . Kidney cancer Father   . Prostate cancer Father     Social History:  reports that  has never smoked. she has never used smokeless tobacco. She reports that she does not drink alcohol or use drugs.  ROS: UROLOGY Frequent Urination?: Yes Hard to postpone urination?: No Burning/pain with urination?: No Get up at night to urinate?:  Yes Leakage of urine?: No Urine stream starts and stops?: No Trouble starting stream?: No Do you have to strain to urinate?: No Blood in urine?: No Urinary tract infection?: No Sexually transmitted disease?: No Injury to kidneys or bladder?: No Painful intercourse?: No Weak stream?: No Currently pregnant?: No Vaginal bleeding?: No Last menstrual period?: n  Gastrointestinal Nausea?: No Vomiting?: No Indigestion/heartburn?: No Diarrhea?: No Constipation?: No  Constitutional Fever: No Night sweats?: No Weight loss?: No Fatigue?: No  Skin Skin rash/lesions?: No Itching?: No  Eyes Blurred vision?: No Double vision?: No  Ears/Nose/Throat Sore throat?: No Sinus problems?: No  Hematologic/Lymphatic Swollen glands?: No Easy bruising?: No  Cardiovascular Leg swelling?: No Chest pain?: No  Respiratory Cough?: No Shortness of breath?: No  Endocrine Excessive thirst?: No  Musculoskeletal Back pain?: Yes Joint pain?: No  Neurological Headaches?: No Dizziness?: Yes  Psychologic Depression?: No Anxiety?: Yes  Physical Exam: BP 137/76   Pulse (!) 59   Ht 5\' 8"  (1.727 m)   Wt 190 lb 6.4 oz (86.4 kg)   BMI 28.95 kg/m   Constitutional:  Alert and oriented, No acute distress. HEENT: [place] AT, moist mucus membranes.  Trachea midline, no masses. Cardiovascular: No clubbing, cyanosis, or edema. Respiratory: Normal respiratory effort, no increased work of breathing. GI: Abdomen is soft, nontender, nondistended, no abdominal masses GU: No CVA tenderness.  Skin: No rashes, bruises or suspicious lesions. Neurologic: Grossly intact, no focal deficits, moving all 4 extremities. Psychiatric: Normal mood and affect.  Laboratory Data: Cr 1.0 on 11/2017   Urinalysis Results for orders placed or performed in visit on 01/19/18  Microscopic Examination  Result Value Ref Range   WBC, UA 0-5 0 - 5 /hpf   RBC, UA None seen 0 - 2 /hpf   Epithelial Cells (non renal)  None seen 0 - 10 /hpf   Bacteria, UA None seen None seen/Few  Urinalysis, Complete  Result Value Ref Range   Specific Gravity, UA <1.005 (L) 1.005 - 1.030   pH, UA 6.5 5.0 - 7.5   Color, UA Yellow Yellow   Appearance Ur Clear Clear   Leukocytes, UA Negative Negative   Protein, UA Negative Negative/Trace   Glucose, UA Negative Negative   Ketones, UA Negative Negative   RBC, UA Trace (A) Negative   Bilirubin, UA Negative Negative   Urobilinogen, Ur 0.2 0.2 - 1.0 mg/dL   Nitrite, UA Negative Negative   Microscopic Examination See below:   BLADDER SCAN AMB NON-IMAGING  Result Value Ref Range   Scan Result 33ml      Pertinent Imaging: US abdomen from 11/08/2017 personally reivewed  Assessment & Plan:    1. Urinary frequency Etiology unclear No  evidence of infection or retention is contributing factor No risk factors for bladder cancer including no smoking, chemical exposure, no evidence of microscopic blood Sudden onset of symptoms with now waning symptoms more suggestive of environmental irritant Reviewed behavioral modification If symptoms fail to resolve, consider cystoscopy in 6 weeks In the meantime, she will implement behavior modification primarily cut back on coffee Not interested in medical intervention at this point time his symptoms are improving - Urinalysis, Complete - BLADDER SCAN AMB NON-IMAGING  2. Stress incontinence of urine Mild, relatively asymptomatic Reviewed weight loss and pelvic floor exercises as primary intervention Not interested in surgical intervention   Return in about 6 weeks (around 03/02/2018) for pelvic with possible cystoscopy.  Hollice Espy, MD  [facility] [address] [place],  [address] [phone]  I spent 45 min with this patient of which greater than 50% was spent in counseling and coordination of care with the patient.

## 2018-01-27 ENCOUNTER — Other Ambulatory Visit: Payer: Self-pay | Admitting: Urology

## 2018-01-28 ENCOUNTER — Telehealth: Payer: Self-pay

## 2018-01-28 NOTE — Telephone Encounter (Signed)
Letter sent.

## 2018-01-28 NOTE — Telephone Encounter (Signed)
-----   Message from Hollice Espy, MD sent at 01/27/2018 11:36 AM EDT ----- Urine cytology is normal.  Great news.    Hollice Espy, MD

## 2018-01-29 NOTE — Telephone Encounter (Signed)
Patient called the office and was advised of the results.

## 2018-03-02 ENCOUNTER — Encounter: Payer: Self-pay | Admitting: Urology

## 2018-03-02 ENCOUNTER — Ambulatory Visit: Payer: Medicare Other | Admitting: Urology

## 2018-03-02 VITALS — BP 148/80 | HR 69 | Ht 68.0 in | Wt 185.0 lb

## 2018-03-02 DIAGNOSIS — R350 Frequency of micturition: Secondary | ICD-10-CM

## 2018-03-02 LAB — MICROSCOPIC EXAMINATION: EPITHELIAL CELLS (NON RENAL): NONE SEEN /HPF (ref 0–10)

## 2018-03-02 LAB — URINALYSIS, COMPLETE
Bilirubin, UA: NEGATIVE
Glucose, UA: NEGATIVE
Ketones, UA: NEGATIVE
Nitrite, UA: NEGATIVE
PH UA: 5 (ref 5.0–7.5)
PROTEIN UA: NEGATIVE
Specific Gravity, UA: 1.02 (ref 1.005–1.030)
Urobilinogen, Ur: 0.2 mg/dL (ref 0.2–1.0)

## 2018-03-02 NOTE — Progress Notes (Signed)
03/02/2018 9:22 AM   Ashley Parrish April 20, 1946 [id number]  Referring provider: Leonel Ramsay, MD [place], [place] [address]  Chief Complaint  Patient presents with  . Urinary Frequency    HPI: 72 year old female who presented last month for further evaluation of urinary frequency and urgency.  Since her last visit, she is implemented behavioral change and cut back significantly on her coffee, now down to 1 cup per morning.  She is a been trying to drink more water throughout the day.  She reports that since last visit, she had dramatic improvement in her urinary symptoms.  She now wakes up at around 5 or 6 in the morning to void but otherwise is not getting up at night.  Her daytime frequency is also decreased.  Urine cytology negative.  No history of microscopic hematuria.  Postvoid residual minimal last visit.  Renal ultrasound fairly unremarkable other than other than for slight atrophy bilaterally on 09/2017.   PMH: Past Medical History:  Diagnosis Date  . Disc disorder   . Headache   . Hypertension   . Lipoma of thigh    left  . PONV (postoperative nausea and vomiting)   . Shingles     Surgical History: Past Surgical History:  Procedure Laterality Date  . BUNIONECTOMY    . COLONOSCOPY    . COLONOSCOPY WITH PROPOFOL N/A 06/25/2015   Procedure: COLONOSCOPY WITH PROPOFOL;  Surgeon: Manya Silvas, MD;  Location: [facility] ENDOSCOPY;  Service: Endoscopy;  Laterality: N/A;  . excision of lipoma      Home Medications:  Allergies as of 03/02/2018      Reactions   Penicillins Nausea Only      Medication List        Accurate as of 03/02/18  9:22 AM. Always use your most recent med list.          amLODipine 5 MG tablet Commonly known as:  NORVASC Take 5 mg by mouth daily.   bisoprolol 5 MG tablet Commonly known as:  ZEBETA Take 5 mg by mouth daily.   bisoprolol-hydrochlorothiazide 2.5-6.25 MG tablet Commonly known as:  ZIAC Take 1  tablet by mouth daily.   calcium elemental as carbonate 400 MG chewable tablet Commonly known as:  BARIATRIC TUMS ULTRA Chew 1,000 mg by mouth 3 (three) times daily.   docusate sodium 50 MG capsule Commonly known as:  COLACE Take 50 mg by mouth 2 (two) times daily.   meclizine 25 MG tablet Commonly known as:  ANTIVERT Take 25 mg by mouth 2 (two) times daily as needed for dizziness.   omega-3 acid ethyl esters 1 g capsule Commonly known as:  LOVAZA Take by mouth daily.   omeprazole 20 MG capsule Commonly known as:  PRILOSEC Take 20 mg by mouth daily.   RED YEAST RICE PO Take by mouth.       Allergies:  Allergies  Allergen Reactions  . Penicillins Nausea Only    Family History: Family History  Problem Relation Age of Onset  . Breast cancer Paternal Aunt 65  . Kidney cancer Father   . Prostate cancer Father     Social History:  reports that she has never smoked. She has never used smokeless tobacco. She reports that she does not drink alcohol or use drugs.  ROS: UROLOGY Frequent Urination?: No Hard to postpone urination?: No Burning/pain with urination?: No Get up at night to urinate?: No Leakage of urine?: No Urine stream starts and stops?:  No Trouble starting stream?: No Do you have to strain to urinate?: No Blood in urine?: No Urinary tract infection?: No Sexually transmitted disease?: No Injury to kidneys or bladder?: No Painful intercourse?: No Weak stream?: No Currently pregnant?: No Vaginal bleeding?: No Last menstrual period?: n  Gastrointestinal Nausea?: No Vomiting?: No Indigestion/heartburn?: No Diarrhea?: No Constipation?: No  Constitutional Fever: No Night sweats?: No Weight loss?: No Fatigue?: No  Skin Skin rash/lesions?: No Itching?: No  Eyes Blurred vision?: No Double vision?: No  Ears/Nose/Throat Sore throat?: No Sinus problems?: No  Hematologic/Lymphatic Swollen glands?: No Easy bruising?:  No  Cardiovascular Leg swelling?: No Chest pain?: No  Respiratory Cough?: No Shortness of breath?: No  Endocrine Excessive thirst?: No  Musculoskeletal Back pain?: No Joint pain?: No  Neurological Headaches?: No Dizziness?: No  Psychologic Depression?: No Anxiety?: No  Physical Exam: BP (!) 148/80   Pulse 69   Ht 5\' 8"  (1.727 m)   Wt 185 lb (83.9 kg)   BMI 28.13 kg/m   Constitutional:  Alert and oriented, No acute distress. HEENT: [place] AT, moist mucus membranes.  Trachea midline, no masses. Cardiovascular: No clubbing, cyanosis, or edema. Respiratory: Normal respiratory effort, no increased work of breathing. Skin: No rashes, bruises or suspicious lesions. Neurologic: Grossly intact, no focal deficits, moving all 4 extremities. Psychiatric: Normal mood and affect.  Laboratory Data: Lab Results  Component Value Date   WBC 6.7 11/20/2014   HGB 14.1 11/20/2014   HCT 44.0 11/20/2014   MCV 92 11/20/2014   PLT 239 11/20/2014    Lab Results  Component Value Date   CREATININE 1.00 09/10/2017   Urinalysis Urinalysis reviewed today, completely negative.  No evidence of infection.  Pertinent Imaging: No new interval imaging  Assessment & Plan:    1. Urinary frequency Improved with behavioral modification At this point time, given her symptoms are improving, no indication for cystoscopy or other intervention.  Not interested in pharmacotherapy.  At this point, she will follow-up as needed.  - Urinalysis, Complete  Hollice Espy, MD  [facility] [address] [place], [place] [address] [phone]

## 2018-07-06 ENCOUNTER — Other Ambulatory Visit: Payer: Self-pay | Admitting: Internal Medicine

## 2018-07-06 DIAGNOSIS — R911 Solitary pulmonary nodule: Secondary | ICD-10-CM

## 2018-07-09 ENCOUNTER — Ambulatory Visit
Admission: RE | Admit: 2018-07-09 | Discharge: 2018-07-09 | Disposition: A | Discharge status: Home / Self Care | Payer: Medicare Other | Source: Ambulatory Visit | Attending: Obstetrics and Gynecology | Admitting: Obstetrics and Gynecology

## 2018-07-09 DIAGNOSIS — Z1231 Encounter for screening mammogram for malignant neoplasm of breast: Secondary | ICD-10-CM | POA: Diagnosis present

## 2018-08-31 ENCOUNTER — Ambulatory Visit
Admission: RE | Admit: 2018-08-31 | Discharge: 2018-08-31 | Disposition: A | Discharge status: Home / Self Care | Payer: Medicare Other | Source: Ambulatory Visit | Attending: Internal Medicine | Admitting: Internal Medicine

## 2018-08-31 DIAGNOSIS — R918 Other nonspecific abnormal finding of lung field: Secondary | ICD-10-CM | POA: Diagnosis not present

## 2018-08-31 DIAGNOSIS — R911 Solitary pulmonary nodule: Secondary | ICD-10-CM | POA: Diagnosis present

## 2018-08-31 DIAGNOSIS — I10 Essential (primary) hypertension: Secondary | ICD-10-CM | POA: Insufficient documentation

## 2018-10-22 ENCOUNTER — Other Ambulatory Visit: Payer: Self-pay | Admitting: Otolaryngology

## 2018-10-22 DIAGNOSIS — R42 Dizziness and giddiness: Secondary | ICD-10-CM

## 2018-10-27 ENCOUNTER — Other Ambulatory Visit (INDEPENDENT_AMBULATORY_CARE_PROVIDER_SITE_OTHER): Payer: Self-pay | Admitting: Internal Medicine

## 2018-10-27 DIAGNOSIS — R42 Dizziness and giddiness: Secondary | ICD-10-CM

## 2018-10-28 ENCOUNTER — Other Ambulatory Visit (INDEPENDENT_AMBULATORY_CARE_PROVIDER_SITE_OTHER): Payer: Self-pay | Admitting: Otolaryngology

## 2018-10-28 ENCOUNTER — Ambulatory Visit (INDEPENDENT_AMBULATORY_CARE_PROVIDER_SITE_OTHER): Payer: Medicare Other

## 2018-10-28 DIAGNOSIS — R42 Dizziness and giddiness: Secondary | ICD-10-CM | POA: Diagnosis not present

## 2018-11-07 ENCOUNTER — Ambulatory Visit
Admission: RE | Admit: 2018-11-07 | Discharge: 2018-11-07 | Disposition: A | Discharge status: Home / Self Care | Payer: Medicare Other | Source: Ambulatory Visit | Attending: Otolaryngology | Admitting: Otolaryngology

## 2018-11-07 DIAGNOSIS — R9089 Other abnormal findings on diagnostic imaging of central nervous system: Secondary | ICD-10-CM | POA: Insufficient documentation

## 2018-11-07 DIAGNOSIS — R42 Dizziness and giddiness: Secondary | ICD-10-CM | POA: Diagnosis present

## 2018-11-07 LAB — POCT I-STAT CREATININE: CREATININE: 1 mg/dL (ref 0.44–1.00)

## 2018-11-07 MED ORDER — GADOBUTROL 1 MMOL/ML IV SOLN
8.0000 mL | Freq: Once | INTRAVENOUS | Status: AC | PRN
  Administered 2018-11-07: 8 mL via INTRAVENOUS

## 2018-11-30 ENCOUNTER — Other Ambulatory Visit: Payer: Self-pay | Admitting: Obstetrics and Gynecology

## 2018-11-30 DIAGNOSIS — Z1231 Encounter for screening mammogram for malignant neoplasm of breast: Secondary | ICD-10-CM

## 2019-03-06 IMAGING — MG MM DIGITAL SCREENING BILAT W/ TOMO W/ CAD
9 of 12 series · 9 of 28 positions shown · non-contrast
Comparison: Previous exam(s).

CLINICAL DATA: Screening.

EXAM:
2D DIGITAL SCREENING BILATERAL MAMMOGRAM WITH CAD AND ADJUNCT TOMO

[L CC synth-2D]
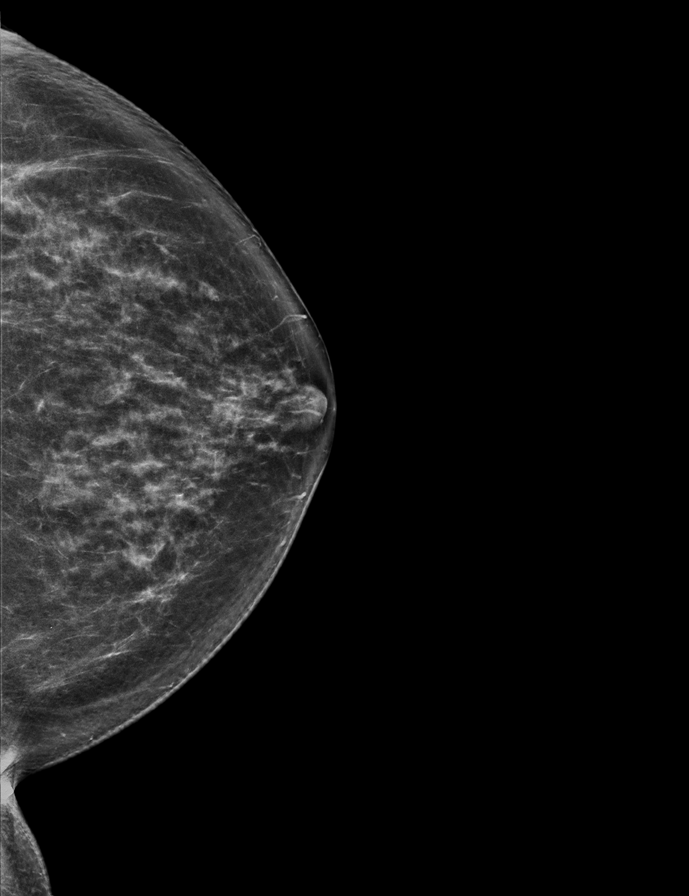

[R MLO synth-2D]
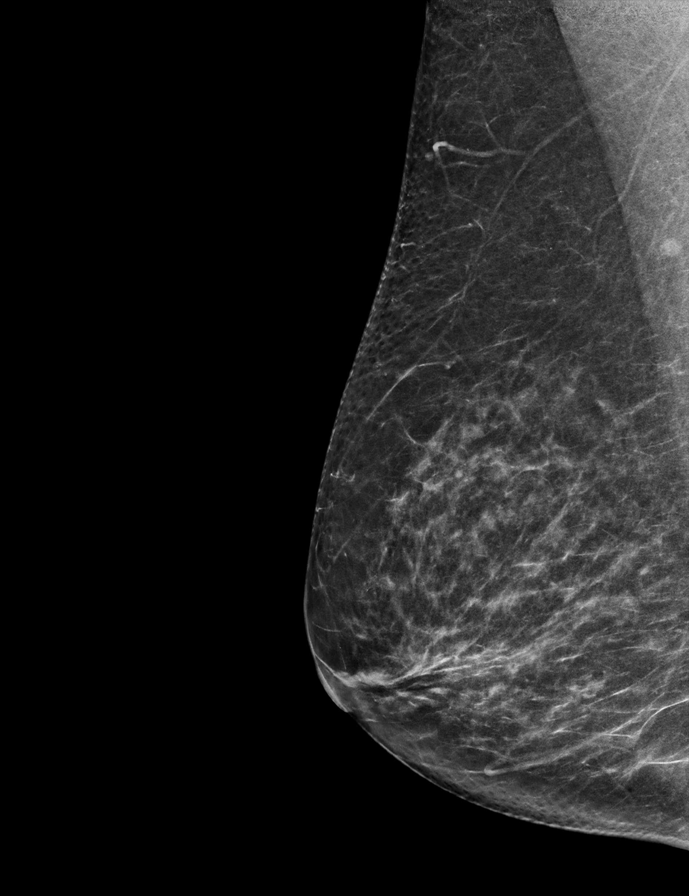

[R MLO]
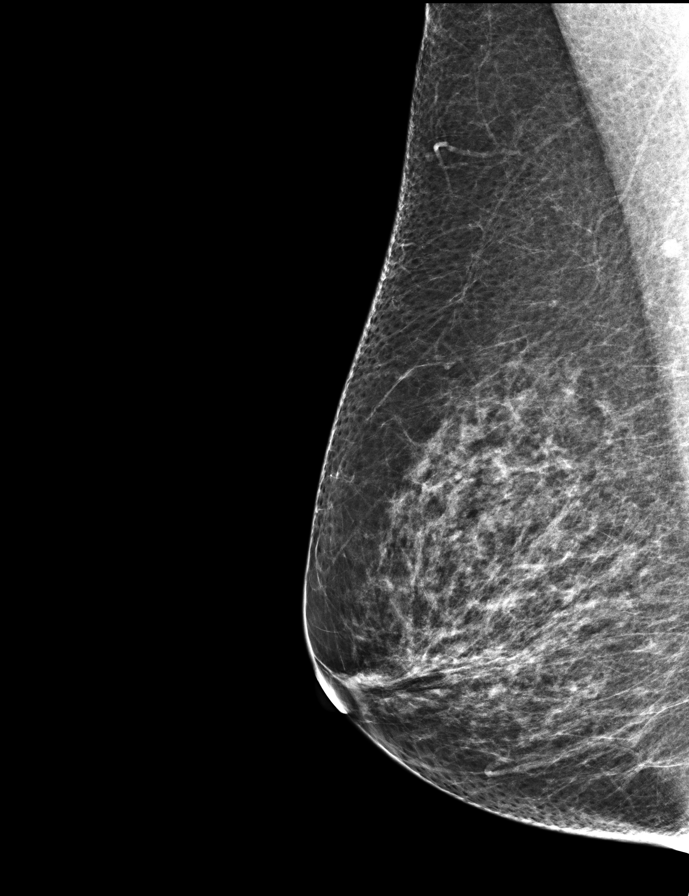

[L MLO synth-2D]
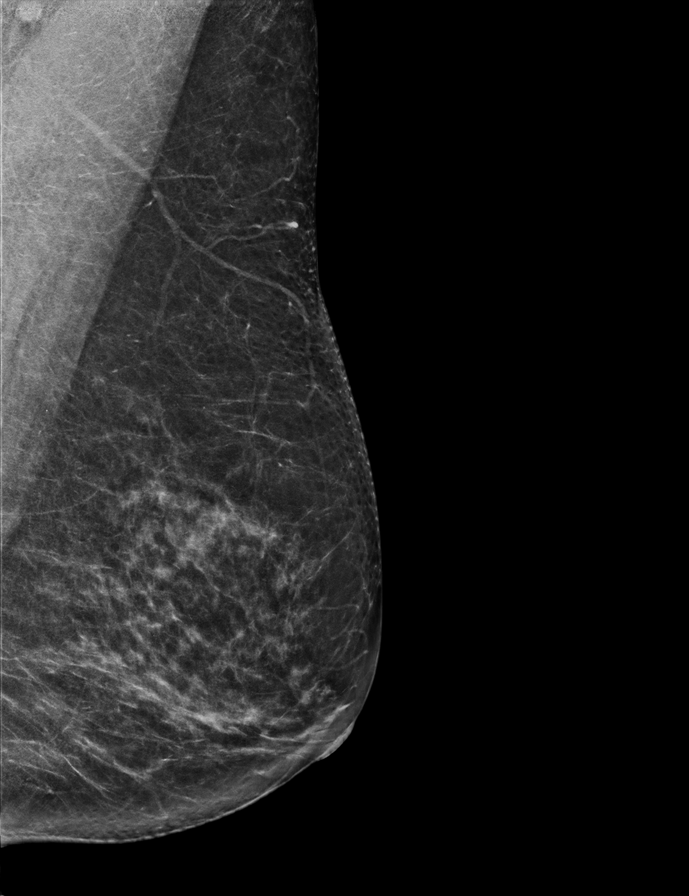

[L MLO]
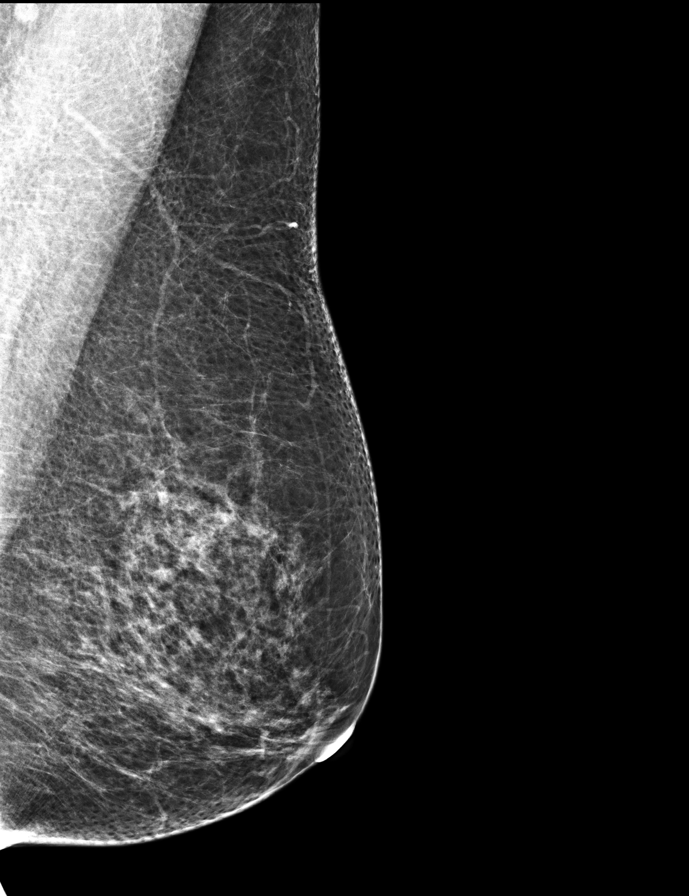

[R CC synth-2D]
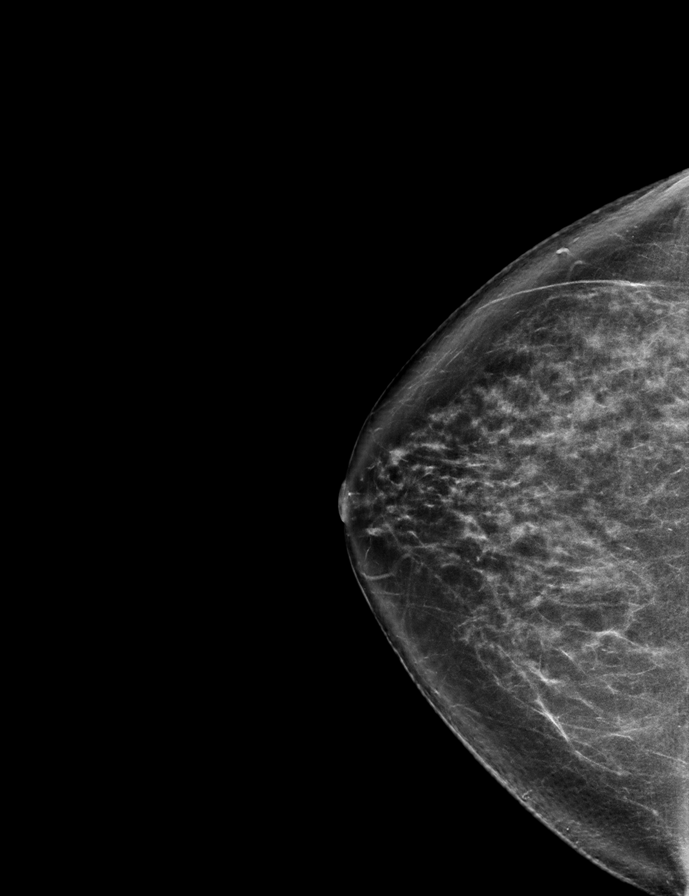

[L CC]
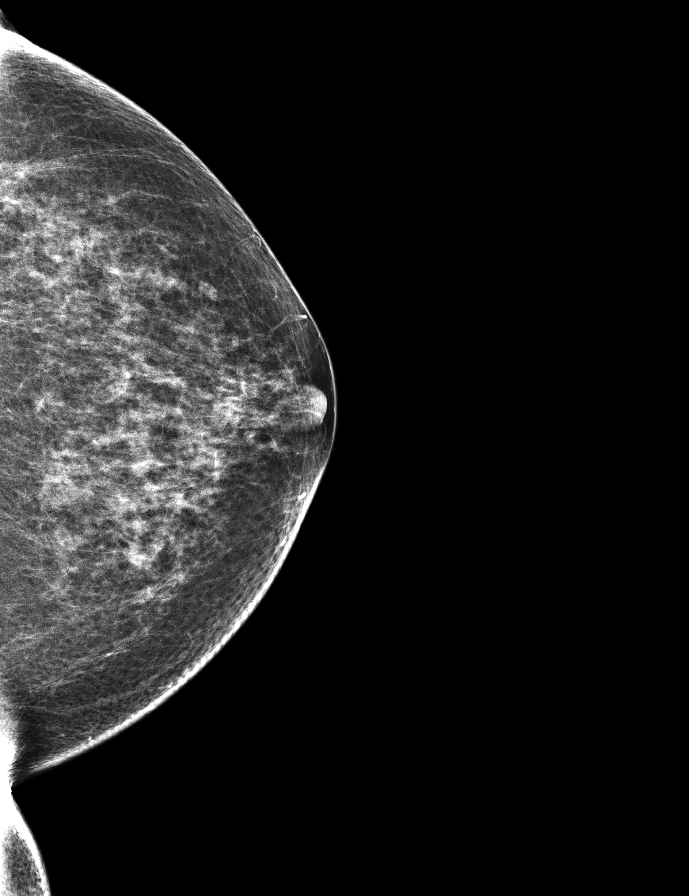

[R CC]
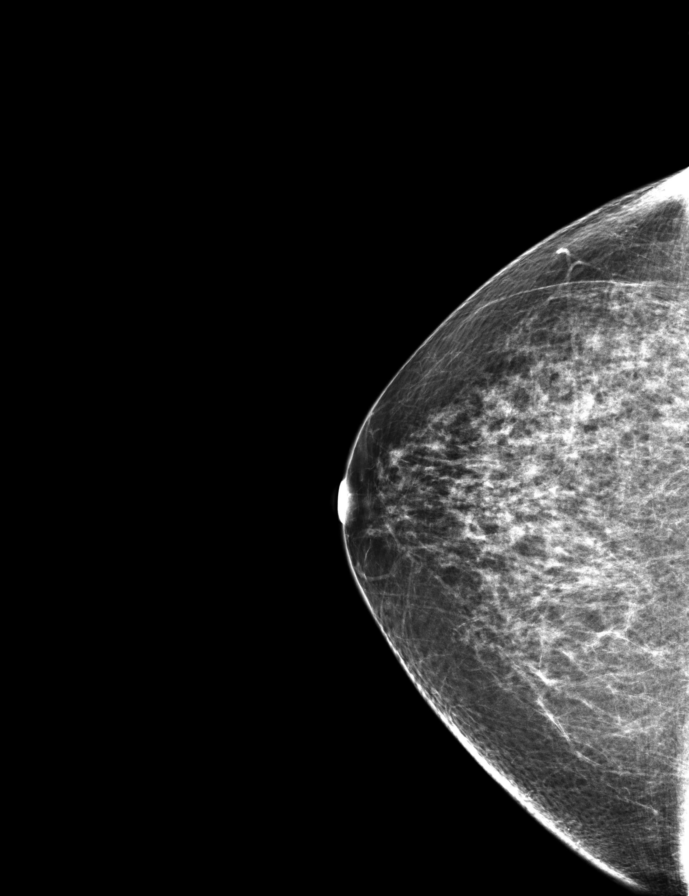

[L CC tomo · tomo slice 34/67.0]
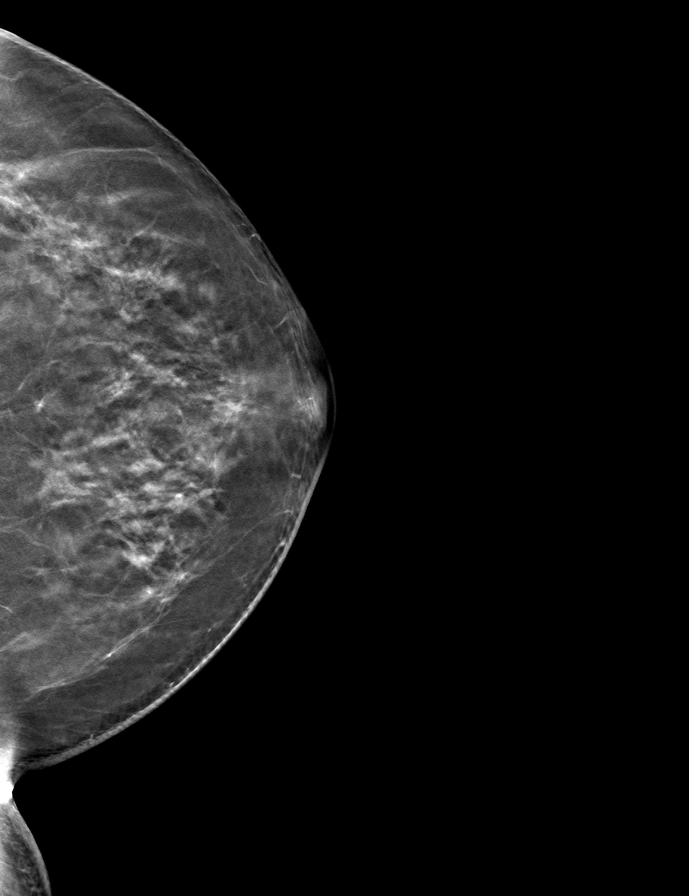

[9 of 28 positions shown; findings below may reference images not displayed]

ACR Breast Density Category b: There are scattered areas of
fibroglandular density.
FINDINGS: There are no findings suspicious for malignancy. Images were
processed with CAD.
IMPRESSION: No mammographic evidence of malignancy. A result letter of this
screening mammogram will be mailed directly to the patient.

RECOMMENDATION:
Screening mammogram in one year. (Code:97-6-RS4)

BI-RADS CATEGORY  1: Negative.

## 2019-07-15 ENCOUNTER — Ambulatory Visit
Admission: RE | Admit: 2019-07-15 | Discharge: 2019-07-15 | Disposition: A | Discharge status: Home / Self Care | Payer: Medicare Other | Source: Ambulatory Visit | Attending: Obstetrics and Gynecology | Admitting: Obstetrics and Gynecology

## 2019-07-15 DIAGNOSIS — Z1231 Encounter for screening mammogram for malignant neoplasm of breast: Secondary | ICD-10-CM | POA: Diagnosis present

## 2019-11-23 ENCOUNTER — Encounter (INDEPENDENT_AMBULATORY_CARE_PROVIDER_SITE_OTHER): Payer: Medicare Other

## 2019-11-28 ENCOUNTER — Other Ambulatory Visit (INDEPENDENT_AMBULATORY_CARE_PROVIDER_SITE_OTHER): Payer: Self-pay | Admitting: Cardiology

## 2019-11-28 DIAGNOSIS — R0989 Other specified symptoms and signs involving the circulatory and respiratory systems: Secondary | ICD-10-CM

## 2019-11-29 ENCOUNTER — Other Ambulatory Visit: Payer: Self-pay

## 2019-11-29 ENCOUNTER — Ambulatory Visit (INDEPENDENT_AMBULATORY_CARE_PROVIDER_SITE_OTHER): Payer: Medicare Other

## 2019-11-29 DIAGNOSIS — R0989 Other specified symptoms and signs involving the circulatory and respiratory systems: Secondary | ICD-10-CM

## 2020-01-12 ENCOUNTER — Other Ambulatory Visit: Payer: Self-pay | Admitting: Internal Medicine

## 2020-01-12 DIAGNOSIS — R911 Solitary pulmonary nodule: Secondary | ICD-10-CM

## 2020-01-26 ENCOUNTER — Other Ambulatory Visit: Payer: Self-pay

## 2020-01-26 ENCOUNTER — Ambulatory Visit
Admission: RE | Admit: 2020-01-26 | Discharge: 2020-01-26 | Disposition: A | Discharge status: Home / Self Care | Payer: Medicare Other | Source: Ambulatory Visit | Attending: Internal Medicine | Admitting: Internal Medicine

## 2020-01-26 DIAGNOSIS — R911 Solitary pulmonary nodule: Secondary | ICD-10-CM | POA: Diagnosis not present

## 2020-05-29 ENCOUNTER — Ambulatory Visit: Payer: Medicare Other | Admitting: Dermatology

## 2020-05-29 ENCOUNTER — Other Ambulatory Visit: Payer: Self-pay

## 2020-05-29 DIAGNOSIS — D1800 Hemangioma unspecified site: Secondary | ICD-10-CM

## 2020-05-29 DIAGNOSIS — I781 Nevus, non-neoplastic: Secondary | ICD-10-CM

## 2020-05-29 DIAGNOSIS — D2272 Melanocytic nevi of left lower limb, including hip: Secondary | ICD-10-CM

## 2020-05-29 DIAGNOSIS — L578 Other skin changes due to chronic exposure to nonionizing radiation: Secondary | ICD-10-CM

## 2020-05-29 DIAGNOSIS — L818 Other specified disorders of pigmentation: Secondary | ICD-10-CM | POA: Diagnosis not present

## 2020-05-29 DIAGNOSIS — Z1283 Encounter for screening for malignant neoplasm of skin: Secondary | ICD-10-CM | POA: Diagnosis not present

## 2020-05-29 DIAGNOSIS — L0100 Impetigo, unspecified: Secondary | ICD-10-CM

## 2020-05-29 DIAGNOSIS — L819 Disorder of pigmentation, unspecified: Secondary | ICD-10-CM

## 2020-05-29 DIAGNOSIS — L814 Other melanin hyperpigmentation: Secondary | ICD-10-CM

## 2020-05-29 DIAGNOSIS — L820 Inflamed seborrheic keratosis: Secondary | ICD-10-CM

## 2020-05-29 DIAGNOSIS — D229 Melanocytic nevi, unspecified: Secondary | ICD-10-CM | POA: Diagnosis not present

## 2020-05-29 DIAGNOSIS — L821 Other seborrheic keratosis: Secondary | ICD-10-CM

## 2020-05-29 DIAGNOSIS — D225 Melanocytic nevi of trunk: Secondary | ICD-10-CM

## 2020-05-29 MED ORDER — MUPIROCIN 2 % EX OINT: 1.0000 "application " | TOPICAL_OINTMENT | Freq: Two times a day (BID) | CUTANEOUS | 0 refills | Status: DC

## 2020-05-29 NOTE — Patient Instructions (Signed)
Recommend daily broad spectrum sunscreen SPF 30+ to sun-exposed areas, reapply every 2 hours as needed. Call for new or changing lesions.  

## 2020-05-29 NOTE — Progress Notes (Signed)
Follow-Up Visit   Subjective  Ashley Parrish is a 74 y.o. female who presents for the following: Annual Exam.  Patient here today for TBSE. No history of skin cancer. There is nothing new or changing that patient is aware of.  Patient was using [employer] lightning cream with tretinoin to treat [place] 2ndary to treatment with LN2 for ISK's. She advises it caused the spots being treated to get fiery red.   The following portions of the chart were reviewed this encounter and updated as appropriate:      Review of Systems:  No other skin or systemic complaints except as noted in HPI or Assessment and Plan.  Objective  Well appearing patient in no apparent distress; mood and affect are within normal limits.  A full examination was performed including scalp, head, eyes, ears, nose, lips, neck, chest, axillae, abdomen, back, buttocks, bilateral upper extremities, bilateral lower extremities, hands, feet, fingers, toes, fingernails, and toenails. All findings within normal limits unless otherwise noted below.  Objective  Left Lower Leg: Violaceous brown macules and patches  Objective  Lower Legs: Dilated blood vessels/small varicosities  Objective  Left Lower Hip: 1.2cm brown flat papule  Right Abdomen: 3.19mm medium dark brown macule  Objective  Right Shoulder - Anterior: Pink waxy stuck-on papule  Objective  Right Nostril: Mild crusting  Objective  Tip of Nose: 6 x 50mm light brown macule, darker inferior  Right Lower Eyelid at mucosal margin: 9mm light brown macule   Assessment & Plan  Post-inflammatory pigmentary changes Left Lower Leg  2ndary to LN2 for ISK's  Pt unable to use SM triple fade cream with tretinoin due to skin reaction Continue sunscreen daily. Discussed areas will continue to fade slowly on it's own  Telangiectasia Lower Legs  Benign, observe.    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  Benign-appearing.  Observation.  Call clinic for new or changing moles.  Recommend daily use of broad spectrum spf 30+ sunscreen to sun-exposed areas.    Inflamed seborrheic keratosis Right Shoulder - Anterior  Reassured benign age-related growth.  Recommend observation.  Discussed cryotherapy if spot(s) become irritated or inflamed. Pt defers at this time since not bothersome.   Impetigo Right Nostril  Start mupirocin to the inside of nose twice daily for 1 week. .  Ordered Medications: mupirocin ointment (BACTROBAN) 2 %  Lentigines (2) Right Lower Eyelid at mucosal margin; Tip of Nose  Benign-appearing.  Observation.  Call clinic for new or changing moles.  Recommend daily use of broad spectrum spf 30+ sunscreen to sun-exposed areas.     Lentigines - Scattered tan macules - Discussed due to sun exposure - Benign, observe - Call for any changes  Seborrheic Keratoses - Stuck-on, waxy, tan-brown papules and plaques  - Discussed benign etiology and prognosis. - Observe - Call for any changes  Melanocytic Nevi - Tan-brown and/or pink-flesh-colored symmetric macules and papules - Benign appearing on exam today - Observation - Call clinic for new or changing moles - Recommend daily use of broad spectrum spf 30+ sunscreen to sun-exposed areas.   Hemangiomas - Red papules - Discussed benign nature - Observe - Call for any changes  Actinic Damage - diffuse scaly erythematous macules with underlying dyspigmentation - Recommend daily broad spectrum sunscreen SPF 30+ to sun-exposed areas, reapply every 2 hours as needed.  - Call for new or changing lesions.  Skin cancer screening performed today.   Return in about  1 year (around 05/29/2021) for TBSE.  Graciella Belton, RMA, am acting as scribe for Brendolyn Patty, MD . Documentation: I have reviewed the above documentation for accuracy and completeness, and I agree with the above.  Brendolyn Patty MD

## 2020-07-17 ENCOUNTER — Other Ambulatory Visit: Payer: Self-pay | Admitting: Obstetrics and Gynecology

## 2020-07-17 DIAGNOSIS — Z1231 Encounter for screening mammogram for malignant neoplasm of breast: Secondary | ICD-10-CM

## 2020-07-30 ENCOUNTER — Ambulatory Visit
Admission: RE | Admit: 2020-07-30 | Discharge: 2020-07-30 | Disposition: A | Discharge status: Home / Self Care | Payer: Medicare Other | Source: Ambulatory Visit | Attending: Obstetrics and Gynecology | Admitting: Obstetrics and Gynecology

## 2020-07-30 ENCOUNTER — Other Ambulatory Visit: Payer: Self-pay

## 2020-07-30 DIAGNOSIS — Z1231 Encounter for screening mammogram for malignant neoplasm of breast: Secondary | ICD-10-CM | POA: Diagnosis not present

## 2020-09-26 ENCOUNTER — Ambulatory Visit: Payer: Medicare Other | Admitting: Dermatology

## 2020-09-26 ENCOUNTER — Other Ambulatory Visit: Payer: Self-pay

## 2020-09-26 DIAGNOSIS — L738 Other specified follicular disorders: Secondary | ICD-10-CM | POA: Diagnosis not present

## 2020-09-26 DIAGNOSIS — L814 Other melanin hyperpigmentation: Secondary | ICD-10-CM

## 2020-09-26 NOTE — Progress Notes (Signed)
   Follow-Up Visit   Subjective  Ashley Parrish is a 74 y.o. female who presents for the following: Lentigo (right lower eyelid at mucosal margin). Patient would like this area looked at again. She hasn't noticed any changes since her last visit in July. 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 She has a follow-up appointment at [facility] in Feb 2022.  The following portions of the chart were reviewed this encounter and updated as appropriate:      Review of Systems:  No other skin or systemic complaints except as noted in HPI or Assessment and Plan.  Objective  Well appearing patient in no apparent distress; mood and affect are within normal limits.  A focused examination was performed including face. Relevant physical exam findings are noted in the Assessment and Plan.  Objective  Right Lower Eyelid at Mucosal Margin: 1.90mm light brown macule  Images         Assessment & Plan  Lentigines Right Lower Eyelid at Mucosal Margin  Benign-appearing.  No changes from last visit or noticed by patient. Stable. Observation.  Call clinic for new or changing growths.  Recommend daily use of broad spectrum spf 30+ sunscreen to sun-exposed areas. Sunglasses when outdoors  Photos taken today.  Patient has follow-up with cornea specialist at [facility] in Feb 2022 and will have the doctor evaluate this area at that time.  If changes noted before that, she will RTC   Sebaceous Hyperplasia - Small yellow papules with a central dell of the left medial canthus - Benign, can treat with ED to remove, noncovered. - Observe  Return as scheduled for TBSE.   IJamesetta Orleans, CMA, am acting as scribe for Brendolyn Patty, MD .  Documentation: I have reviewed the above documentation for accuracy and completeness, and I agree with the above.  Brendolyn Patty MD

## 2021-06-24 ENCOUNTER — Encounter: Payer: Medicare Other | Admitting: Dermatology

## 2021-06-24 ENCOUNTER — Ambulatory Visit: Payer: Medicare Other | Admitting: Dermatology

## 2021-07-08 ENCOUNTER — Other Ambulatory Visit: Payer: Self-pay | Admitting: Obstetrics and Gynecology

## 2021-07-08 DIAGNOSIS — Z1231 Encounter for screening mammogram for malignant neoplasm of breast: Secondary | ICD-10-CM

## 2021-08-02 ENCOUNTER — Other Ambulatory Visit: Payer: Self-pay

## 2021-08-02 ENCOUNTER — Ambulatory Visit
Admission: RE | Admit: 2021-08-02 | Discharge: 2021-08-02 | Disposition: A | Discharge status: Home / Self Care | Payer: Medicare Other | Source: Ambulatory Visit | Attending: Obstetrics and Gynecology | Admitting: Obstetrics and Gynecology

## 2021-08-02 DIAGNOSIS — Z1231 Encounter for screening mammogram for malignant neoplasm of breast: Secondary | ICD-10-CM | POA: Diagnosis present

## 2021-08-26 ENCOUNTER — Other Ambulatory Visit: Payer: Self-pay

## 2021-08-26 ENCOUNTER — Ambulatory Visit: Payer: Medicare Other | Admitting: Dermatology

## 2021-08-26 DIAGNOSIS — D2222 Melanocytic nevi of left ear and external auricular canal: Secondary | ICD-10-CM

## 2021-08-26 DIAGNOSIS — D225 Melanocytic nevi of trunk: Secondary | ICD-10-CM

## 2021-08-26 DIAGNOSIS — L905 Scar conditions and fibrosis of skin: Secondary | ICD-10-CM

## 2021-08-26 DIAGNOSIS — Q825 Congenital non-neoplastic nevus: Secondary | ICD-10-CM | POA: Diagnosis not present

## 2021-08-26 DIAGNOSIS — L820 Inflamed seborrheic keratosis: Secondary | ICD-10-CM | POA: Diagnosis not present

## 2021-08-26 DIAGNOSIS — L578 Other skin changes due to chronic exposure to nonionizing radiation: Secondary | ICD-10-CM

## 2021-08-26 DIAGNOSIS — Z872 Personal history of diseases of the skin and subcutaneous tissue: Secondary | ICD-10-CM | POA: Diagnosis not present

## 2021-08-26 DIAGNOSIS — L814 Other melanin hyperpigmentation: Secondary | ICD-10-CM

## 2021-08-26 DIAGNOSIS — D229 Melanocytic nevi, unspecified: Secondary | ICD-10-CM

## 2021-08-26 DIAGNOSIS — Z1283 Encounter for screening for malignant neoplasm of skin: Secondary | ICD-10-CM

## 2021-08-26 DIAGNOSIS — L821 Other seborrheic keratosis: Secondary | ICD-10-CM

## 2021-08-26 DIAGNOSIS — D1800 Hemangioma unspecified site: Secondary | ICD-10-CM

## 2021-08-26 DIAGNOSIS — L738 Other specified follicular disorders: Secondary | ICD-10-CM

## 2021-08-26 DIAGNOSIS — K130 Diseases of lips: Secondary | ICD-10-CM

## 2021-08-26 NOTE — Progress Notes (Signed)
Follow-Up Visit   Subjective  Ashley Parrish is a 75 y.o. female who presents for the following: Total body skin exam (Hx of AKs) and chec spot (L arm, ~80m, started out itchy, now scaly).  She has quite a few itchy, irritated spots all over body. She also has had a rash in the corners of her mouth for several months.   The following portions of the chart were reviewed this encounter and updated as appropriate:       Review of Systems:  No other skin or systemic complaints except as noted in HPI or Assessment and Plan.  Objective  Well appearing patient in no apparent distress; mood and affect are within normal limits.  A full examination was performed including scalp, head, eyes, ears, nose, lips, neck, chest, axillae, abdomen, back, buttocks, bilateral upper extremities, bilateral lower extremities, hands, feet, fingers, toes, fingernails, and toenails. All findings within normal limits unless otherwise noted below.  L lower hip 1.4 x 1.0cm brown flat pap    R Gluteal cleft Fleshy nodule  R medial buttock 4.50mm med brown pap darker center  R abdomen 3.1mm med dark brown macule  L upper abdomen 5.0 x 3.55mm med brown macule  L inguinal crease 4.0 x 2.37mm dark brown macule  L lower ear helix 3.51mm light brown macule  tip of nose 6.0 x 5.37mm tan/brown macule darker inferior, tip of nose   Left Upper Arm x 3, L forearm x 1, L mid lower back x 1, R spinal mid back x 1, L lower pretibia x 1, R post ankle x 1, L lat thigh x 1, L inframammary x 3, R inframammary x 3, R ant thigh x 1, L lower cheek x 1, Total = 17 (17) Erythematous keratotic or waxy stuck-on papule or plaque.   R lat ankle 5.4mm pink/white smooth slightly indurated depressed papule c/w scar  perioral Erythema and scale bil oral commissures.   Assessment & Plan   Lentigines - Scattered tan macules - Due to sun exposure - Benign-appearing, observe - Recommend daily broad spectrum sunscreen SPF 30+  to sun-exposed areas, reapply every 2 hours as needed. - Call for any changes  Seborrheic Keratoses - Stuck-on, waxy, tan-brown papules and/or plaques  - Benign-appearing - Discussed benign etiology and prognosis. - Observe - Call for any changes  Melanocytic Nevi - Tan-brown and/or pink-flesh-colored symmetric macules and papules - Benign appearing on exam today - Observation - Call clinic for new or changing moles - Recommend daily use of broad spectrum spf 30+ sunscreen to sun-exposed areas.   Hemangiomas - Red papules - Discussed benign nature - Observe - Call for any changes  Actinic Damage - Chronic condition, secondary to cumulative UV/sun exposure - diffuse scaly erythematous macules with underlying dyspigmentation - Recommend daily broad spectrum sunscreen SPF 30+ to sun-exposed areas, reapply every 2 hours as needed.  - Staying in the shade or wearing long sleeves, sun glasses (UVA+UVB protection) and wide brim hats (4-inch brim around the entire circumference of the hat) are also recommended for sun protection.  - Call for new or changing lesions.  Skin cancer screening performed today.  Hx of biopsy proven Lentigo, R lower eyelid - procedure by Dr. Norlene Duel - Clear today  Sebaceous Hyperplasia - Small yellow papules with a central dell - Benign - Observe  - R paranasal  History of PreCancerous Actinic Keratosis  - site(s) of PreCancerous Actinic Keratosis clear today. - these may recur and new  lesions may form requiring treatment to prevent transformation into skin cancer - observe for new or changing spots and contact [place] for appointment if occur - photoprotection with sun protective clothing; sunglasses and broad spectrum sunscreen with SPF of at least 30 + and frequent self skin exams recommended - yearly exams by a dermatologist recommended for persons with history of PreCancerous Actinic Keratoses   Congenital non-neoplastic  nevus L lower hip  Benign-appearing.  Observation.  Call clinic for new or changing moles.  Recommend daily use of broad spectrum spf 30+ sunscreen to sun-exposed areas.    Nevus (6) L lower ear helix; R medial buttock; L inguinal crease; L upper abdomen; R abdomen; R Gluteal cleft  Benign-appearing.  Observation.  Call clinic for new or changing moles.  Recommend daily use of broad spectrum spf 30+ sunscreen to sun-exposed areas.    Lentigines tip of nose  Benign-appearing.  Stable. Observation.  Call clinic for new or changing moles.  Recommend daily use of broad spectrum spf 30+ sunscreen to sun-exposed areas.    Inflamed seborrheic keratosis Left Upper Arm x 3, L forearm x 1, L mid lower back x 1, R spinal mid back x 1, L lower pretibia x 1, R post ankle x 1, L lat thigh x 1, L inframammary x 3, R inframammary x 3, R ant thigh x 1, L lower cheek x 1, Total = 17  Destruction of lesion - Left Upper Arm x 3, L forearm x 1, L mid lower back x 1, R spinal mid back x 1, L lower pretibia x 1, R post ankle x 1, L lat thigh x 1, L inframammary x 3, R inframammary x 3, R ant thigh x 1, L lower cheek x 1, Total = 17  Destruction method: cryotherapy   Informed consent: discussed and consent obtained   Lesion destroyed using liquid nitrogen: Yes   Region frozen until ice ball extended beyond lesion: Yes   Outcome: patient tolerated procedure well with no complications   Post-procedure details: wound care instructions given   Additional details:  Prior to procedure, discussed risks of blister formation, small wound, skin dyspigmentation, or rare scar following cryotherapy. Recommend Vaseline ointment to treated areas while healing.   Scar R lat ankle  Benign, observe  Angular cheilitis perioral  Chronic skin irritation in folds of corners of mouth due to moist environment from saliva, causing overgrowth of yeast and bacteria, resulting in chronic inflammation.   Start otc Lotrimin or  Micatin cream qd/bid  Sample of Eucrisa oint qd/bid, Lot TACA 12/24    Return in about 1 year (around 08/26/2022) for TBSE, Hx of AKs.  I, Othelia Pulling, RMA, am acting as scribe for Brendolyn Patty, MD .  Documentation: I have reviewed the above documentation for accuracy and completeness, and I agree with the above.  Brendolyn Patty MD

## 2021-08-26 NOTE — Patient Instructions (Addendum)
If you have any questions or concerns for your doctor, please call our main line at [phone] and press option 4 to reach your doctor's medical assistant. If no one answers, please leave a voicemail as directed and we will return your call as soon as possible. Messages left after 4 pm will be answered the following business day.   You may also send [place] a message via [place]. We typically respond to MyChart messages within 1-2 business days.  For prescription refills, please ask your pharmacy to contact our office. Our fax number is [phone].  If you have an urgent issue when the clinic is closed that cannot wait until the next business day, you can page your doctor at the number below.    Please note that while we do our best to be available for urgent issues outside of office hours, we are not available 24/7.   If you have an urgent issue and are unable to reach [place], you may choose to seek medical care at your doctor's office, retail clinic, urgent care center, or emergency room.  If you have a medical emergency, please immediately call 911 or go to the emergency department.  Pager Numbers  - Dr. Nehemiah Massed: [phone]  - Dr. Laurence Ferrari: [phone]  - Dr. Nicole Kindred: [phone]  In the event of inclement weather, please call our main line at [phone] for an update on the status of any delays or closures.  Dermatology Medication Tips: Please keep the boxes that topical medications come in in order to help keep track of the instructions about where and how to use these. Pharmacies typically print the medication instructions only on the boxes and not directly on the medication tubes.   If your medication is too expensive, please contact our office at [phone] option 4 or send [place] a message through [place].   We are unable to tell what your co-pay for medications will be in advance as this is different depending on your insurance coverage. However, we may be able to find a substitute  medication at lower cost or fill out paperwork to get insurance to cover a needed medication.   If a prior authorization is required to get your medication covered by your insurance company, please allow [place] 1-2 business days to complete this process.  Drug prices often vary depending on where the prescription is filled and some pharmacies may offer cheaper prices.  The website www.goodrx.com contains coupons for medications through different pharmacies. The prices here do not account for what the cost may be with help from insurance (it may be cheaper with your insurance), but the website can give you the price if you did not use any insurance.  - You can print the associated coupon and take it with your prescription to the pharmacy.  - You may also stop by our office during regular business hours and pick up a GoodRx coupon card.  - If you need your prescription sent electronically to a different pharmacy, notify our office through [facility] or by phone at [phone] option 4.   Cryotherapy Aftercare  Wash gently with soap and water everyday.   Apply Vaseline and Band-Aid daily until healed.    Melanoma ABCDEs  Melanoma is the most dangerous type of skin cancer, and is the leading cause of death from skin disease.  You are more likely to develop melanoma if you: Have light-colored skin, light-colored eyes, or red or blond hair Spend a lot of time in the sun Tan regularly,  either outdoors or in a tanning bed Have had blistering sunburns, especially during childhood Have a close family member who has had a melanoma Have atypical moles or large birthmarks  Early detection of melanoma is key since treatment is typically straightforward and cure rates are extremely high if we catch it early.   The first sign of melanoma is often a change in a mole or a new dark spot.  The ABCDE system is a way of remembering the signs of melanoma.  A for asymmetry:  The two halves do not  match. B for border:  The edges of the growth are irregular. C for color:  A mixture of colors are present instead of an even brown color. D for diameter:  Melanomas are usually (but not always) greater than 4mm - the size of a pencil eraser. E for evolution:  The spot keeps changing in size, shape, and color.  Please check your skin once per month between visits. You can use a small mirror in front and a large mirror behind you to keep an eye on the back side or your body.   If you see any new or changing lesions before your next follow-up, please call to schedule a visit.  Please continue daily skin protection including broad spectrum sunscreen SPF 30+ to sun-exposed areas, reapplying every 2 hours as needed when you're outdoors.   Staying in the shade or wearing long sleeves, sun glasses (UVA+UVB protection) and wide brim hats (4-inch brim around the entire circumference of the hat) are also recommended for sun protection.     Start Lotrimin or Micatin cream for rash around the mouth

## 2021-09-02 ENCOUNTER — Ambulatory Visit: Payer: Medicare Other | Admitting: Dermatology

## 2021-11-06 ENCOUNTER — Other Ambulatory Visit: Payer: Self-pay

## 2021-11-06 MED ORDER — VALACYCLOVIR HCL 1 G PO TABS: 1000.0000 mg | ORAL_TABLET | Freq: Two times a day (BID) | ORAL | 0 refills | Status: DC

## 2021-11-06 NOTE — Progress Notes (Signed)
Valtrex RX sent in. Patient asked for something topical and oral to help treat current cold sores. Advised I could not send in anything to use topically but I could send in a Valtrex RX for her to use. Advised patient of directions on phone.

## 2021-11-07 ENCOUNTER — Other Ambulatory Visit (HOSPITAL_COMMUNITY): Payer: Self-pay | Admitting: Physician Assistant

## 2021-11-07 ENCOUNTER — Other Ambulatory Visit: Payer: Self-pay | Admitting: Physician Assistant

## 2021-11-07 DIAGNOSIS — M2392 Unspecified internal derangement of left knee: Secondary | ICD-10-CM

## 2021-11-14 ENCOUNTER — Ambulatory Visit
Admission: RE | Admit: 2021-11-14 | Discharge: 2021-11-14 | Disposition: A | Discharge status: Home / Self Care | Payer: Medicare Other | Source: Ambulatory Visit | Attending: Physician Assistant | Admitting: Physician Assistant

## 2021-11-14 DIAGNOSIS — M2392 Unspecified internal derangement of left knee: Secondary | ICD-10-CM | POA: Diagnosis present

## 2021-11-27 ENCOUNTER — Ambulatory Visit: Payer: Medicare Other | Admitting: Dermatology

## 2021-11-27 ENCOUNTER — Encounter: Payer: Self-pay | Admitting: Dermatology

## 2021-11-27 ENCOUNTER — Other Ambulatory Visit: Payer: Self-pay

## 2021-11-27 DIAGNOSIS — L820 Inflamed seborrheic keratosis: Secondary | ICD-10-CM

## 2021-11-27 DIAGNOSIS — L814 Other melanin hyperpigmentation: Secondary | ICD-10-CM | POA: Diagnosis not present

## 2021-11-27 DIAGNOSIS — L821 Other seborrheic keratosis: Secondary | ICD-10-CM | POA: Diagnosis not present

## 2021-11-27 NOTE — Progress Notes (Signed)
° °  Follow-Up Visit   Subjective  Ashley Parrish is a 75 y.o. female who presents for the following: lesions (Arms. May have more ISK's/recurrent ISK's to treat. Irritated, itching. ). New spot came up near previously treated ISK on L arm.    The following portions of the chart were reviewed this encounter and updated as appropriate:      Review of Systems: No other skin or systemic complaints except as noted in HPI or Assessment and Plan.   Objective  Well appearing patient in no apparent distress; mood and affect are within normal limits.  A focused examination was performed including face, arms, hands. Relevant physical exam findings are noted in the Assessment and Plan.  Left Upper medial arm x1, small residual at left medial upper arm at previously treated site x1 (2) Erythematous keratotic or waxy stuck-on papule   Assessment & Plan  Inflamed seborrheic keratosis (2) Left Upper medial arm x1, small residual at left medial upper arm at previously treated site x1  Destruction of lesion - Left Upper medial arm x1, small residual at left medial upper arm at previously treated site x1  Destruction method: cryotherapy   Informed consent: discussed and consent obtained   Lesion destroyed using liquid nitrogen: Yes   Region frozen until ice ball extended beyond lesion: Yes   Outcome: patient tolerated procedure well with no complications   Post-procedure details: wound care instructions given   Additional details:  Prior to procedure, discussed risks of blister formation, small wound, skin dyspigmentation, or rare scar following cryotherapy. Recommend Vaseline ointment to treated areas while healing.    Seborrheic Keratoses - Stuck-on, waxy, tan-brown papules and/or plaques on arms - Benign-appearing - Discussed benign etiology and prognosis. - Observe - Call for any changes   Lentigines - Scattered tan macules on arms - Due to sun exposure - Benign-appering, observe -  Recommend daily broad spectrum sunscreen SPF 30+ to sun-exposed areas, reapply every 2 hours as needed. - Call for any changes   Return for TBSE As Scheduled.  I, Emelia Salisbury, CMA, am acting as scribe for Brendolyn Patty, MD.  Documentation: I have reviewed the above documentation for accuracy and completeness, and I agree with the above.  Brendolyn Patty MD

## 2021-11-27 NOTE — Patient Instructions (Addendum)
Cryotherapy Aftercare  Wash gently with soap and water everyday.   Apply Vaseline and Band-Aid daily until healed.   Prior to procedure, discussed risks of blister formation, small wound, skin dyspigmentation, or rare scar following cryotherapy. Recommend Vaseline ointment to treated areas while healing.   If You Need Anything After Your Visit  If you have any questions or concerns for your doctor, please call our main line at [phone] and press option 4 to reach your doctor's medical assistant. If no one answers, please leave a voicemail as directed and we will return your call as soon as possible. Messages left after 4 pm will be answered the following business day.   You may also send [place] a message via [place]. We typically respond to MyChart messages within 1-2 business days.  For prescription refills, please ask your pharmacy to contact our office. Our fax number is [phone].  If you have an urgent issue when the clinic is closed that cannot wait until the next business day, you can page your doctor at the number below.    Please note that while we do our best to be available for urgent issues outside of office hours, we are not available 24/7.   If you have an urgent issue and are unable to reach [place], you may choose to seek medical care at your doctor's office, retail clinic, urgent care center, or emergency room.  If you have a medical emergency, please immediately call 911 or go to the emergency department.  Pager Numbers  - Dr. Nehemiah Massed: [phone]  - Dr. Laurence Ferrari: [phone]  - Dr. Nicole Kindred: [phone]  In the event of inclement weather, please call our main line at [phone] for an update on the status of any delays or closures.  Dermatology Medication Tips: Please keep the boxes that topical medications come in in order to help keep track of the instructions about where and how to use these. Pharmacies typically print the medication instructions only on the  boxes and not directly on the medication tubes.   If your medication is too expensive, please contact our office at [phone] option 4 or send [place] a message through [place].   We are unable to tell what your co-pay for medications will be in advance as this is different depending on your insurance coverage. However, we may be able to find a substitute medication at lower cost or fill out paperwork to get insurance to cover a needed medication.   If a prior authorization is required to get your medication covered by your insurance company, please allow [place] 1-2 business days to complete this process.  Drug prices often vary depending on where the prescription is filled and some pharmacies may offer cheaper prices.  The website www.goodrx.com contains coupons for medications through different pharmacies. The prices here do not account for what the cost may be with help from insurance (it may be cheaper with your insurance), but the website can give you the price if you did not use any insurance.  - You can print the associated coupon and take it with your prescription to the pharmacy.  - You may also stop by our office during regular business hours and pick up a GoodRx coupon card.  - If you need your prescription sent electronically to a different pharmacy, notify our office through [facility] or by phone at [phone] option 4.     Si Usted Necesita Algo Despus de Su Visita  Tambin puede enviarnos un mensaje a [place]  de MyChart. Por lo general respondemos a los mensajes de MyChart en el transcurso de 1 a 2 das hbiles.  Para renovar recetas, por favor pida a su farmacia que se ponga en contacto con nuestra oficina. Harland Dingwall de fax es [place] [phone].  Si tiene un asunto urgente cuando la clnica est cerrada y que no puede esperar hasta el siguiente da hbil, puede llamar/localizar a su doctor(a) al nmero que aparece a continuacin.   Por favor, tenga en cuenta que  aunque hacemos todo lo posible para estar disponibles para asuntos urgentes fuera del horario de [place], no estamos disponibles las 24 horas del da, los 7 das de la [place].   Si tiene un problema urgente y no puede comunicarse con nosotros, puede optar por buscar atencin mdica  en el consultorio de su doctor(a), en una clnica privada, en un centro de atencin urgente o en una sala de emergencias.  Si tiene Engineering geologist, por favor llame inmediatamente al 911 o vaya a la sala de emergencias.  Nmeros de bper  - Dr. Nehemiah Massed: [phone]  - Dra. Moye: [phone]  - Dra. Nicole Kindred: [phone]  En caso de inclemencias del [place], por favor llame a Johnsie Kindred principal al [phone] para una actualizacin sobre el [place] de cualquier retraso o cierre.  Consejos para la medicacin en dermatologa: Por favor, guarde las cajas en las que vienen los medicamentos de uso tpico para ayudarle a seguir las instrucciones sobre dnde y cmo usarlos. Las farmacias generalmente imprimen las instrucciones del medicamento slo en las cajas y no directamente en los tubos del [place].   Si su medicamento es muy caro, por favor, pngase en contacto con Zigmund Daniel llamando al [phone] y presione la opcin 4 o envenos un mensaje a travs de Pharmacist, community.   No podemos decirle cul ser su copago por los medicamentos por adelantado ya que esto es diferente dependiendo de la cobertura de su seguro. Sin embargo, es posible que podamos encontrar un medicamento sustituto a Electrical engineer un formulario para que el seguro cubra el medicamento que se considera necesario.   Si se requiere una autorizacin previa para que su compaa de seguros [place] su medicamento, por favor permtanos de 1 a 2 das hbiles para completar este proceso.  Los precios de los medicamentos varan con frecuencia dependiendo del Environmental consultant de dnde se surte la receta y alguna farmacias pueden ofrecer precios ms  baratos.  El sitio web www.goodrx.com tiene cupones para medicamentos de Airline pilot. Los precios aqu no tienen en cuenta lo que podra costar con la ayuda del seguro (puede ser ms barato con su seguro), pero el sitio web puede darle el precio si no utiliz Research scientist (physical sciences).  - Puede imprimir el cupn correspondiente y llevarlo con su receta a la farmacia.  - Tambin puede pasar por nuestra oficina durante el horario de atencin regular y Charity fundraiser una tarjeta de cupones de GoodRx.  - Si necesita que su receta se enve electrnicamente a una farmacia diferente, informe a nuestra oficina a travs de MyChart de  o por telfono llamando al [phone] y presione la opcin 4.

## 2022-07-15 ENCOUNTER — Other Ambulatory Visit: Payer: Self-pay | Admitting: Obstetrics and Gynecology

## 2022-07-15 DIAGNOSIS — Z1231 Encounter for screening mammogram for malignant neoplasm of breast: Secondary | ICD-10-CM

## 2022-08-05 ENCOUNTER — Ambulatory Visit
Admission: RE | Admit: 2022-08-05 | Discharge: 2022-08-05 | Disposition: A | Discharge status: Home / Self Care | Payer: Medicare Other | Source: Ambulatory Visit | Attending: Obstetrics and Gynecology | Admitting: Obstetrics and Gynecology

## 2022-08-05 DIAGNOSIS — Z1231 Encounter for screening mammogram for malignant neoplasm of breast: Secondary | ICD-10-CM | POA: Diagnosis not present

## 2022-09-01 ENCOUNTER — Encounter: Payer: Medicare Other | Admitting: Dermatology

## 2022-09-08 ENCOUNTER — Ambulatory Visit: Payer: Medicare Other | Admitting: Dermatology

## 2022-09-08 DIAGNOSIS — L814 Other melanin hyperpigmentation: Secondary | ICD-10-CM | POA: Diagnosis not present

## 2022-09-08 DIAGNOSIS — Z1283 Encounter for screening for malignant neoplasm of skin: Secondary | ICD-10-CM

## 2022-09-08 DIAGNOSIS — B009 Herpesviral infection, unspecified: Secondary | ICD-10-CM

## 2022-09-08 DIAGNOSIS — D225 Melanocytic nevi of trunk: Secondary | ICD-10-CM

## 2022-09-08 DIAGNOSIS — D229 Melanocytic nevi, unspecified: Secondary | ICD-10-CM

## 2022-09-08 DIAGNOSIS — Q825 Congenital non-neoplastic nevus: Secondary | ICD-10-CM

## 2022-09-08 DIAGNOSIS — L0100 Impetigo, unspecified: Secondary | ICD-10-CM

## 2022-09-08 DIAGNOSIS — L821 Other seborrheic keratosis: Secondary | ICD-10-CM

## 2022-09-08 DIAGNOSIS — L820 Inflamed seborrheic keratosis: Secondary | ICD-10-CM | POA: Diagnosis not present

## 2022-09-08 DIAGNOSIS — L578 Other skin changes due to chronic exposure to nonionizing radiation: Secondary | ICD-10-CM

## 2022-09-08 MED ORDER — MUPIROCIN 2 % EX OINT: 1.0000 | TOPICAL_OINTMENT | Freq: Two times a day (BID) | CUTANEOUS | 3 refills | Status: AC

## 2022-09-08 MED ORDER — VALACYCLOVIR HCL 1 G PO TABS: ORAL_TABLET | ORAL | 3 refills | Status: DC

## 2022-09-08 NOTE — Patient Instructions (Addendum)
Recommend OTC Inkey Tranexamic Acid cream to apply to hyperpigmented areas on face at night (tip of nose).  Can buy at [facility], or online for around $15.   Cryotherapy Aftercare  Wash gently with soap and water everyday.   Apply Vaseline and Band-Aid daily until healed.    Due to recent changes in healthcare laws, you may see results of your pathology and/or laboratory studies on MyChart before the doctors have had a chance to review them. We understand that in some cases there may be results that are confusing or concerning to you. Please understand that not all results are received at the same time and often the doctors may need to interpret multiple results in order to provide you with the best plan of care or course of treatment. Therefore, we ask that you please give [place] 2 business days to thoroughly review all your results before contacting the office for clarification. Should we see a critical lab result, you will be contacted sooner.   If You Need Anything After Your Visit  If you have any questions or concerns for your doctor, please call our main line at [phone] and press option 4 to reach your doctor's medical assistant. If no one answers, please leave a voicemail as directed and we will return your call as soon as possible. Messages left after 4 pm will be answered the following business day.   You may also send [place] a message via [place]. We typically respond to MyChart messages within 1-2 business days.  For prescription refills, please ask your pharmacy to contact our office. Our fax number is [phone].  If you have an urgent issue when the clinic is closed that cannot wait until the next business day, you can page your doctor at the number below.    Please note that while we do our best to be available for urgent issues outside of office hours, we are not available 24/7.   If you have an urgent issue and are unable to reach [place], you may choose to seek medical care  at your doctor's office, retail clinic, urgent care center, or emergency room.  If you have a medical emergency, please immediately call 911 or go to the emergency department.  Pager Numbers  - Dr. Nehemiah Massed: [phone]  - Dr. Laurence Ferrari: [phone]  - Dr. Nicole Kindred: [phone]  In the event of inclement weather, please call our main line at [phone] for an update on the status of any delays or closures.  Dermatology Medication Tips: Please keep the boxes that topical medications come in in order to help keep track of the instructions about where and how to use these. Pharmacies typically print the medication instructions only on the boxes and not directly on the medication tubes.   If your medication is too expensive, please contact our office at [phone] option 4 or send [place] a message through [place].   We are unable to tell what your co-pay for medications will be in advance as this is different depending on your insurance coverage. However, we may be able to find a substitute medication at lower cost or fill out paperwork to get insurance to cover a needed medication.   If a prior authorization is required to get your medication covered by your insurance company, please allow [place] 1-2 business days to complete this process.  Drug prices often vary depending on where the prescription is filled and some pharmacies may offer cheaper prices.  The website www.goodrx.com contains coupons for medications  through different pharmacies. The prices here do not account for what the cost may be with help from insurance (it may be cheaper with your insurance), but the website can give you the price if you did not use any insurance.  - You can print the associated coupon and take it with your prescription to the pharmacy.  - You may also stop by our office during regular business hours and pick up a GoodRx coupon card.  - If you need your prescription sent electronically to a different pharmacy,  notify our office through  [facility] or by phone at [phone] option 4.     Si Usted Necesita Algo Despus de Su Visita  Tambin puede enviarnos un mensaje a travs de Pharmacist, community. Por lo general respondemos a los mensajes de MyChart en el transcurso de 1 a 2 das hbiles.  Para renovar recetas, por favor pida a su farmacia que se ponga en contacto con nuestra oficina. Harland Dingwall de fax es [place] [phone].  Si tiene un asunto urgente cuando la clnica est cerrada y que no puede esperar hasta el siguiente da hbil, puede llamar/localizar a su doctor(a) al nmero que aparece a continuacin.   Por favor, tenga en cuenta que aunque hacemos todo lo posible para estar disponibles para asuntos urgentes fuera del horario de [place], no estamos disponibles las 24 horas del da, los 7 das de la [place].   Si tiene un problema urgente y no puede comunicarse con nosotros, puede optar por buscar atencin mdica  en el consultorio de su doctor(a), en una clnica privada, en un centro de atencin urgente o en una sala de emergencias.  Si tiene Engineering geologist, por favor llame inmediatamente al 911 o vaya a la sala de emergencias.  Nmeros de bper  - Dr. Nehemiah Massed: [phone]  - Dra. Moye: [phone]  - Dra. Nicole Kindred: [phone]  En caso de inclemencias del [place], por favor llame a Johnsie Kindred principal al [phone] para una actualizacin sobre el [place] de cualquier retraso o cierre.  Consejos para la medicacin en dermatologa: Por favor, guarde las cajas en las que vienen los medicamentos de uso tpico para ayudarle a seguir las instrucciones sobre dnde y cmo usarlos. Las farmacias generalmente imprimen las instrucciones del medicamento slo en las cajas y no directamente en los tubos del [place].   Si su medicamento es muy caro, por favor, pngase en contacto con Zigmund Daniel llamando al [phone] y presione la opcin 4 o envenos un mensaje a travs de  Pharmacist, community.   No podemos decirle cul ser su copago por los medicamentos por adelantado ya que esto es diferente dependiendo de la cobertura de su seguro. Sin embargo, es posible que podamos encontrar un medicamento sustituto a Electrical engineer un formulario para que el seguro cubra el medicamento que se considera necesario.   Si se requiere una autorizacin previa para que su compaa de seguros [place] su medicamento, por favor permtanos de 1 a 2 das hbiles para completar este proceso.  Los precios de los medicamentos varan con frecuencia dependiendo del Environmental consultant de dnde se surte la receta y alguna farmacias pueden ofrecer precios ms baratos.  El sitio web www.goodrx.com tiene cupones para medicamentos de Airline pilot. Los precios aqu no tienen en cuenta lo que podra costar con la ayuda del seguro (puede ser ms barato con su seguro), pero el sitio web puede darle el precio si no utiliz Research scientist (physical sciences).  - Puede imprimir el cupn correspondiente y llevarlo  con su receta a la farmacia.  - Tambin puede pasar por nuestra oficina durante el horario de atencin regular y Charity fundraiser una tarjeta de cupones de GoodRx.  - Si necesita que su receta se enve electrnicamente a una farmacia diferente, informe a nuestra oficina a travs de MyChart de [place] o por telfono llamando al [phone] y presione la opcin 4.

## 2022-09-08 NOTE — Progress Notes (Signed)
Follow-Up Visit   Subjective  Ashley Parrish is a 76 y.o. female who presents for the following: Annual Exam.  The patient presents for Total-Body Skin Exam (TBSE) for skin cancer screening and mole check.  The patient has spots, moles and lesions to be evaluated, some may be new or changing. She has a history of Aks. History of HSV and impetigo of the nose. She needs refills of Valtrex and Mupirocin Ointment.    The following portions of the chart were reviewed this encounter and updated as appropriate:       Review of Systems:  No other skin or systemic complaints except as noted in HPI or Assessment and Plan.  Objective  Well appearing patient in no apparent distress; mood and affect are within normal limits.  A full examination was performed including scalp, head, eyes, ears, nose, lips, neck, chest, axillae, abdomen, back, buttocks, bilateral upper extremities, bilateral lower extremities, hands, feet, fingers, toes, fingernails, and toenails. All findings within normal limits unless otherwise noted below.  trunk, extremities R medial buttock 4.44m med brown pap darker center   R abdomen 3.5101mmed dark brown macule   L upper abdomen 5.0 x 3.60m49med brown macule   L inguinal crease 4.0 x 2.60mm83mrk brown macule   L lower ear helix 3.60mm 64mht brown macule  L ant neck 4.0 mm flesh papule  L anterior thigh 4.0 mm speckled brown macule, darker center  Tip of Nose 6.0 x 5.60mm t42mbrown macule darker inferior, tip of nose, no changes       Left Lower  Hip 1.4 x 1.0cm brown flat pap  nose Clear today.  bilateral pretibia x 2 (2) Pink, waxy flat papules         Assessment & Plan  Skin cancer screening performed today.  Actinic Damage - chronic, secondary to cumulative UV radiation exposure/sun exposure over time - diffuse scaly erythematous macules with underlying dyspigmentation - Recommend daily broad spectrum sunscreen SPF 30+ to sun-exposed  areas, reapply every 2 hours as needed.  - Recommend staying in the shade or wearing long sleeves, sun glasses (UVA+UVB protection) and wide brim hats (4-inch brim around the entire circumference of the hat). - Call for new or changing lesions.  Lentigines - Scattered tan macules, face - Due to sun exposure - Benign-appearing, observe - Recommend daily broad spectrum sunscreen SPF 30+ to sun-exposed areas, reapply every 2 hours as needed. - Call for any changes  Seborrheic Keratoses - Stuck-on, waxy, tan-brown papules and/or plaques  - Benign-appearing - Discussed benign etiology and prognosis. - Observe - Call for any changes  Nevus trunk, extremities  Benign-appearing, stable.  Observation.  Call clinic for new or changing moles.  Recommend daily use of broad spectrum spf 30+ sunscreen to sun-exposed areas.   Lentigo Tip of Nose  Benign appearing. Observation.  Recommend OTC Inkey Tranexamic Acid cream to apply to hyperpigmented areas on face at night.  Can buy at [facility], [place] for around $15. Also can try OTC retinol qhs Patient had reaction to SM Triple Cream w/tretinoin in the past (legs). Continue sunscreen spf 30+ qam  Pt not interested in cryotherapy Discussed BBL treatment, $350 for the entire face.   Congenital non-neoplastic nevus Left Lower  Hip  Benign-appearing, stable.  Observation.  Call clinic for new or changing moles.  Recommend daily use of broad spectrum spf 30+ sunscreen to sun-exposed areas.    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  Clear today.  Herpes Simplex Virus = Cold Sores = Fever Blisters is a chronic recurring blistering; scabbing sore-producing viral infection that is recurrent usually in the same area triggered by stress, sun/UV exposure and trauma.  It is infectious and can be spread from person to person by direct contact.  It is not curable, but is treatable with topical and oral medication.  Continue Valtrex 1000 mg  take 2 po at first onset of symptoms of fever blister, then take 2 po 12 hrs later dsp #30 3Rf.  Continue mupirocin 2% ointment Apply into the nose BID prn irritation dsp 22g 3Rf.  Impetigo  Related Medications mupirocin ointment (BACTROBAN) 2 % Place 1 Application into the nose 2 (two) times daily.  Inflamed seborrheic keratosis (2) bilateral pretibia x 2  vs Early BCC  Recheck on f/u.  Destruction of lesion - bilateral pretibia x 2  Destruction method: cryotherapy   Informed consent: discussed and consent obtained   Lesion destroyed using liquid nitrogen: Yes   Region frozen until ice ball extended beyond lesion: Yes   Outcome: patient tolerated procedure well with no complications   Post-procedure details: wound care instructions given   Additional details:  Prior to procedure, discussed risks of blister formation, small wound, skin dyspigmentation, or rare scar following cryotherapy. Recommend Vaseline ointment to treated areas while healing.   Hx of biopsy proven Lentigo, R lower eyelid - procedure by Dr. Norlene Duel - Clear today  Melanocytic Nevi - Tan-brown and/or pink-flesh-colored symmetric macules and papules, including right antihelix, left gluteal cleft - Benign appearing on exam today - Observation - Call clinic for new or changing moles - Recommend daily use of broad spectrum spf 30+ sunscreen to sun-exposed areas.   Hemangiomas - Red papules - Discussed benign nature - Observe - Call for any changes  Return in about 6 months (around 03/10/2023) for recheck nasa. tip and bil pretibia. Also 1 year for TBSE.Lindi Adie, CMA, am acting as scribe for Brendolyn Patty, MD .  Documentation: I have reviewed the above documentation for accuracy and completeness, and I agree with the above.  Brendolyn Patty MD

## 2022-09-15 ENCOUNTER — Encounter (INDEPENDENT_AMBULATORY_CARE_PROVIDER_SITE_OTHER): Payer: Self-pay

## 2022-10-13 ENCOUNTER — Emergency Department: Payer: Medicare Other

## 2022-10-13 ENCOUNTER — Emergency Department
Admission: EM | Admit: 2022-10-13 | Discharge: 2022-10-13 | Disposition: A | Discharge status: Home / Self Care | Payer: Medicare Other | Attending: Student in an Organized Health Care Education/Training Program | Admitting: Student in an Organized Health Care Education/Training Program

## 2022-10-13 ENCOUNTER — Encounter: Payer: Self-pay | Admitting: Emergency Medicine

## 2022-10-13 ENCOUNTER — Other Ambulatory Visit: Payer: Self-pay

## 2022-10-13 DIAGNOSIS — Y9301 Activity, walking, marching and hiking: Secondary | ICD-10-CM | POA: Diagnosis not present

## 2022-10-13 DIAGNOSIS — W010XXA Fall on same level from slipping, tripping and stumbling without subsequent striking against object, initial encounter: Secondary | ICD-10-CM | POA: Insufficient documentation

## 2022-10-13 DIAGNOSIS — S99921A Unspecified injury of right foot, initial encounter: Secondary | ICD-10-CM | POA: Diagnosis present

## 2022-10-13 DIAGNOSIS — S82831A Other fracture of upper and lower end of right fibula, initial encounter for closed fracture: Secondary | ICD-10-CM | POA: Insufficient documentation

## 2022-10-13 NOTE — ED Provider Notes (Signed)
[facility] Provider Note    Event Date/Time   First MD Initiated Contact with Patient 10/13/22 1849     (approximate)   History   Ankle Pain   HPI  Ashley Parrish is a 76 y.o. female presents to the ER for evaluation of right foot pain that occurred after she had a fall while she was out walking in her yard and tripped and leaves.  Did not hit her head.  No neck pain.  No chest pain.      Physical Exam   Triage Vital Signs: ED Triage Vitals  Enc Vitals Group     BP 10/13/22 1643 (!) 139/109     Pulse Rate 10/13/22 1643 64     Resp 10/13/22 1643 18     Temp 10/13/22 1643 97.9 F (36.6 C)     Temp Source 10/13/22 1643 Oral     SpO2 10/13/22 1643 98 %     Weight 10/13/22 1839 184 lb 15.5 oz (83.9 kg)     Height 10/13/22 1839 _0  (1.727 m)     Head Circumference --      Peak Flow --      Pain Score 10/13/22 1640 3     Pain Loc --      Pain Edu? --      Excl. in [place]? --     Most recent vital signs: Vitals:   10/13/22 1643  BP: (!) 139/109  Pulse: 64  Resp: 18  Temp: 97.9 F (36.6 C)  SpO2: 98%     Constitutional: Alert  Eyes: Conjunctivae are normal.  Head: Atraumatic. Nose: No congestion/rhinnorhea. Mouth/Throat: Mucous membranes are moist.   Neck: Painless ROM.  Cardiovascular:   Good peripheral circulation. Respiratory: Normal respiratory effort.  No retractions.  Gastrointestinal: Soft and nontender.  Musculoskeletal:   lateral ttp and swelling of lateral mal of right foot.  N/v intact.  No ttp of base of 5th met, no knee pain Neurologic:  MAE spontaneously. No gross focal neurologic deficits are appreciated.  Skin:  Skin is warm, dry and intact. No rash noted. Psychiatric: Mood and affect are normal. Speech and behavior are normal.    ED Results / Procedures / Treatments   Labs (all labs ordered are listed, but only abnormal results are displayed) Labs Reviewed - No data to display   EKG     RADIOLOGY Please  see ED Course for my review and interpretation.  I personally reviewed all radiographic images ordered to evaluate for the above acute complaints and reviewed radiology reports and findings.  These findings were personally discussed with the patient.  Please see medical record for radiology report.    PROCEDURES:  Critical Care performed: No  Procedures   MEDICATIONS ORDERED IN ED: Medications - No data to display   IMPRESSION / MDM / [place] / ED COURSE  I reviewed the triage vital signs and the nursing notes.                              Differential diagnosis includes, but is not limited to, fracture, contusion, dislocation  Presented to the ER for evaluation of right ankle injury.  Has evidence of distal right fibular fracture without significant displacement on my review and interpretation of the x-ray.  She is neurovascular intact.  Will be placed in immobilizer with boot made nonweightbearing.  Will be placed in cam boot  as she has wound that she has been managing from recent dermatologic procedure and this will allow her to continue dressing changes.  Patient was given referral to close follow-up with podiatry.      FINAL CLINICAL IMPRESSION(S) / ED DIAGNOSES   Final diagnoses:  Other closed fracture of distal end of right fibula, initial encounter     Rx / DC Orders   ED Discharge Orders     None        Note:  This document was prepared using Dragon voice recognition software and may include unintentional dictation errors.    Merlyn Lot, MD 10/13/22 1929

## 2022-10-13 NOTE — ED Triage Notes (Signed)
Patient to ED for right ankle injury. Patient states she was working with leaves in the yard and fell. Unable to walk on ankle.

## 2022-10-13 NOTE — ED Notes (Signed)
Pt Dc to home. Cam boot and crutches applied with pt verbalizes and demonstrating understanding of use. Pt verbalizes understanding of dc instructions. Pt assisted out of dept via wheelchair with family member.

## 2022-10-20 ENCOUNTER — Ambulatory Visit: Payer: Medicare Other | Admitting: Podiatry

## 2022-10-21 ENCOUNTER — Ambulatory Visit: Payer: Medicare Other | Admitting: Podiatry

## 2023-03-09 ENCOUNTER — Ambulatory Visit: Payer: Medicare Other | Admitting: Dermatology

## 2023-03-09 ENCOUNTER — Other Ambulatory Visit: Payer: Self-pay | Admitting: Dermatology

## 2023-03-09 VITALS — BP 143/79 | HR 51

## 2023-03-09 DIAGNOSIS — L814 Other melanin hyperpigmentation: Secondary | ICD-10-CM

## 2023-03-09 DIAGNOSIS — L820 Inflamed seborrheic keratosis: Secondary | ICD-10-CM | POA: Diagnosis not present

## 2023-03-09 DIAGNOSIS — D225 Melanocytic nevi of trunk: Secondary | ICD-10-CM

## 2023-03-09 DIAGNOSIS — D229 Melanocytic nevi, unspecified: Secondary | ICD-10-CM

## 2023-03-09 MED ORDER — HYDROQUINONE 4 % EX CREA: TOPICAL_CREAM | CUTANEOUS | 2 refills | Status: DC

## 2023-03-09 NOTE — Progress Notes (Signed)
Follow-Up Visit   Subjective  Ashley Parrish is a 77 y.o. female who presents for the following: 6 months f/u of the discoloration on her nose treated with otc products for a few months with a poor response. The patient has spots on her bilateral legs and arms and back to be evaluated, some may be new or changing. Some are irritated.   The following portions of the chart were reviewed this encounter and updated as appropriate: medications, allergies, medical history  Review of Systems:  No other skin or systemic complaints except as noted in HPI or Assessment and Plan.  Objective  Well appearing patient in no apparent distress; mood and affect are within normal limits.  A focused examination was performed of the following areas: face,nose,bilateral legs, back   Relevant exam findings are noted in the Assessment and Plan.  left medial knee x 1, left pretibial x 3, right pretibial x 1, left hand x 1, right lower back x 1 (7) Small residual papules    Assessment & Plan   LENTIGINES Exam: tan macule, no change when compared to photo. Location: tip of nose   Due to sun exposure  Counseling for BBL / IPL / Laser and Coordination of Care Discussed the treatment option of Broad Band Light (BBL) /Intense Pulsed Light (IPL)/ Laser for skin discoloration, including brown spots and redness.  Typically we recommend at least 1-3 treatment sessions about 5-8 weeks apart for best results.  Cannot have tanned skin when BBL performed, and regular use of sunscreen is advised after the procedure to help maintain results. The patient's condition may also require "maintenance treatments" in the future.  The fee for BBL / laser treatments is $350 per treatment session for the whole face.  A fee can be quoted for other parts of the body.  Insurance typically does not pay for BBL/laser treatments and therefore the fee is an out-of-pocket cost.   Benign-appearing, observe. Recommend daily broad spectrum  sunscreen SPF 30+ to sun-exposed areas, reapply every 2 hours as needed.  Call for any changes   Start Hydroquinone 4% cream apply to affected area on the nose twice a day   Inflamed seborrheic keratosis (7) left medial knee x 1, left pretibial x 3, right pretibial x 1, left hand x 1, right lower back x 1  Residual ISK   Symptomatic, irritating, patient would like treated.   Destruction of lesion - left medial knee x 1, left pretibial x 3, right pretibial x 1, left hand x 1, right lower back x 1  Destruction method: cryotherapy   Informed consent: discussed and consent obtained   Timeout:  patient name, date of birth, surgical site, and procedure verified Lesion destroyed using liquid nitrogen: Yes   Region frozen until ice ball extended beyond lesion: Yes   Outcome: patient tolerated procedure well with no complications   Post-procedure details: wound care instructions given   Additional details:  Prior to procedure, discussed risks of blister formation, small wound, skin dyspigmentation, or rare scar following cryotherapy. Recommend Vaseline ointment to treated areas while healing.    NEVUS Right sacral  4.0 mm brown papule darker center   Benign-appearing.  Observation.  Call clinic for new or changing moles.  Recommend daily use of broad spectrum spf 30+ sunscreen to sun-exposed areas.    Return in about 6 months (around 09/08/2023) for TBSE, BBL for lentigo-fall .  I, Angelique Holm, CMA, am acting as scribe for Willeen Niece, MD .  Documentation: I have reviewed the above documentation for accuracy and completeness, and I agree with the above.  Willeen Niece, MD

## 2023-03-09 NOTE — Patient Instructions (Addendum)
Cryotherapy Aftercare  Wash gently with soap and water everyday.   Apply Vaseline and Band-Aid daily until healed.     Due to recent changes in healthcare laws, you may see results of your pathology and/or laboratory studies on MyChart before the doctors have had a chance to review them. We understand that in some cases there may be results that are confusing or concerning to you. Please understand that not all results are received at the same time and often the doctors may need to interpret multiple results in order to provide you with the best plan of care or course of treatment. Therefore, we ask that you please give us 2 business days to thoroughly review all your results before contacting the office for clarification. Should we see a critical lab result, you will be contacted sooner.   If You Need Anything After Your Visit  If you have any questions or concerns for your doctor, please call our main line at [phone] and press option 4 to reach your doctor's medical assistant. If no one answers, please leave a voicemail as directed and we will return your call as soon as possible. Messages left after 4 pm will be answered the following business day.   You may also send us a message via MyChart. We typically respond to MyChart messages within 1-2 business days.  For prescription refills, please ask your pharmacy to contact our office. Our fax number is [phone].  If you have an urgent issue when the clinic is closed that cannot wait until the next business day, you can page your doctor at the number below.    Please note that while we do our best to be available for urgent issues outside of office hours, we are not available 24/7.   If you have an urgent issue and are unable to reach us, you may choose to seek medical care at your doctor's office, retail clinic, urgent care center, or emergency room.  If you have a medical emergency, please immediately call 911 or go to the  emergency department.  Pager Numbers  - Dr. Kowalski: [phone]  - Dr. Moye: [phone]  - Dr. Stewart: [phone]  In the event of inclement weather, please call our main line at [phone] for an update on the status of any delays or closures.  Dermatology Medication Tips: Please keep the boxes that topical medications come in in order to help keep track of the instructions about where and how to use these. Pharmacies typically print the medication instructions only on the boxes and not directly on the medication tubes.   If your medication is too expensive, please contact our office at [phone] option 4 or send us a message through MyChart.   We are unable to tell what your co-pay for medications will be in advance as this is different depending on your insurance coverage. However, we may be able to find a substitute medication at lower cost or fill out paperwork to get insurance to cover a needed medication.   If a prior authorization is required to get your medication covered by your insurance company, please allow us 1-2 business days to complete this process.  Drug prices often vary depending on where the prescription is filled and some pharmacies may offer cheaper prices.  The website www.goodrx.com contains coupons for medications through different pharmacies. The prices here do not account for what the cost may be with help from insurance (it may be cheaper with your insurance), but the website can   give you the price if you did not use any insurance.  - You can print the associated coupon and take it with your prescription to the pharmacy.  - You may also stop by our office during regular business hours and pick up a GoodRx coupon card.  - If you need your prescription sent electronically to a different pharmacy, notify our office through [place] MyChart or by phone at [phone] option 4.     Si Usted Necesita Algo Despus de Su Visita  Tambin puede  enviarnos un mensaje a travs de MyChart. Por lo general respondemos a los mensajes de MyChart en el transcurso de 1 a 2 das hbiles.  Para renovar recetas, por favor pida a su farmacia que se ponga en contacto con nuestra oficina. Nuestro nmero de fax es el [phone].  Si tiene un asunto urgente cuando la clnica est cerrada y que no puede esperar hasta el siguiente da hbil, puede llamar/localizar a su doctor(a) al nmero que aparece a continuacin.   Por favor, tenga en cuenta que aunque hacemos todo lo posible para estar disponibles para asuntos urgentes fuera del horario de oficina, no estamos disponibles las 24 horas del da, los 7 das de la semana.   Si tiene un problema urgente y no puede comunicarse con nosotros, puede optar por buscar atencin mdica  en el consultorio de su doctor(a), en una clnica privada, en un centro de atencin urgente o en una sala de emergencias.  Si tiene una emergencia mdica, por favor llame inmediatamente al 911 o vaya a la sala de emergencias.  Nmeros de bper  - Dr. Kowalski: [phone]  - Dra. Moye: [phone]  - Dra. Stewart: [phone]  En caso de inclemencias del tiempo, por favor llame a nuestra lnea principal al [phone] para una actualizacin sobre el estado de cualquier retraso o cierre.  Consejos para la medicacin en dermatologa: Por favor, guarde las cajas en las que vienen los medicamentos de uso tpico para ayudarle a seguir las instrucciones sobre dnde y cmo usarlos. Las farmacias generalmente imprimen las instrucciones del medicamento slo en las cajas y no directamente en los tubos del medicamento.   Si su medicamento es muy caro, por favor, pngase en contacto con nuestra oficina llamando al [phone] y presione la opcin 4 o envenos un mensaje a travs de MyChart.   No podemos decirle cul ser su copago por los medicamentos por adelantado ya que esto es diferente dependiendo de la cobertura de su seguro.  Sin embargo, es posible que podamos encontrar un medicamento sustituto a menor costo o llenar un formulario para que el seguro cubra el medicamento que se considera necesario.   Si se requiere una autorizacin previa para que su compaa de seguros cubra su medicamento, por favor permtanos de 1 a 2 das hbiles para completar este proceso.  Los precios de los medicamentos varan con frecuencia dependiendo del lugar de dnde se surte la receta y alguna farmacias pueden ofrecer precios ms baratos.  El sitio web www.goodrx.com tiene cupones para medicamentos de diferentes farmacias. Los precios aqu no tienen en cuenta lo que podra costar con la ayuda del seguro (puede ser ms barato con su seguro), pero el sitio web puede darle el precio si no utiliz ningn seguro.  - Puede imprimir el cupn correspondiente y llevarlo con su receta a la farmacia.  - Tambin puede pasar por nuestra oficina durante el horario de atencin regular y recoger una tarjeta de cupones de GoodRx.  -   Si necesita que su receta se enve electrnicamente a una farmacia diferente, informe a nuestra oficina a travs de MyChart de [place] o por telfono llamando al [phone] y presione la opcin 4.  

## 2023-07-28 ENCOUNTER — Other Ambulatory Visit: Payer: Self-pay | Admitting: Obstetrics and Gynecology

## 2023-07-28 DIAGNOSIS — Z1231 Encounter for screening mammogram for malignant neoplasm of breast: Secondary | ICD-10-CM

## 2023-08-17 ENCOUNTER — Ambulatory Visit
Admission: RE | Admit: 2023-08-17 | Discharge: 2023-08-17 | Disposition: A | Discharge status: Home / Self Care | Payer: Medicare Other | Source: Ambulatory Visit | Attending: Obstetrics and Gynecology | Admitting: Obstetrics and Gynecology

## 2023-08-17 DIAGNOSIS — Z1231 Encounter for screening mammogram for malignant neoplasm of breast: Secondary | ICD-10-CM | POA: Diagnosis present

## 2023-09-21 ENCOUNTER — Ambulatory Visit: Payer: Medicare Other | Admitting: Dermatology

## 2023-09-21 DIAGNOSIS — L820 Inflamed seborrheic keratosis: Secondary | ICD-10-CM

## 2023-09-21 DIAGNOSIS — B009 Herpesviral infection, unspecified: Secondary | ICD-10-CM

## 2023-09-21 DIAGNOSIS — W908XXA Exposure to other nonionizing radiation, initial encounter: Secondary | ICD-10-CM | POA: Diagnosis not present

## 2023-09-21 DIAGNOSIS — L814 Other melanin hyperpigmentation: Secondary | ICD-10-CM | POA: Diagnosis not present

## 2023-09-21 DIAGNOSIS — L578 Other skin changes due to chronic exposure to nonionizing radiation: Secondary | ICD-10-CM | POA: Diagnosis not present

## 2023-09-21 DIAGNOSIS — B001 Herpesviral vesicular dermatitis: Secondary | ICD-10-CM

## 2023-09-21 DIAGNOSIS — D1801 Hemangioma of skin and subcutaneous tissue: Secondary | ICD-10-CM

## 2023-09-21 DIAGNOSIS — D2222 Melanocytic nevi of left ear and external auricular canal: Secondary | ICD-10-CM

## 2023-09-21 DIAGNOSIS — D224 Melanocytic nevi of scalp and neck: Secondary | ICD-10-CM

## 2023-09-21 DIAGNOSIS — L821 Other seborrheic keratosis: Secondary | ICD-10-CM

## 2023-09-21 DIAGNOSIS — D225 Melanocytic nevi of trunk: Secondary | ICD-10-CM

## 2023-09-21 DIAGNOSIS — Q825 Congenital non-neoplastic nevus: Secondary | ICD-10-CM

## 2023-09-21 DIAGNOSIS — D229 Melanocytic nevi, unspecified: Secondary | ICD-10-CM

## 2023-09-21 DIAGNOSIS — Z1283 Encounter for screening for malignant neoplasm of skin: Secondary | ICD-10-CM | POA: Diagnosis not present

## 2023-09-21 DIAGNOSIS — B351 Tinea unguium: Secondary | ICD-10-CM

## 2023-09-21 DIAGNOSIS — Z872 Personal history of diseases of the skin and subcutaneous tissue: Secondary | ICD-10-CM

## 2023-09-21 MED ORDER — JUBLIA 10 % EX SOLN: 1.0000 | Freq: Every day | CUTANEOUS | 11 refills | Status: DC

## 2023-09-21 MED ORDER — VALACYCLOVIR HCL 1 G PO TABS: 1000.0000 mg | ORAL_TABLET | ORAL | 11 refills | Status: DC

## 2023-09-21 NOTE — Patient Instructions (Addendum)

## 2023-09-21 NOTE — Progress Notes (Signed)
Follow-Up Visit   Subjective  COREENA RUBALCAVA is a 77 y.o. female who presents for the following: Skin Cancer Screening and Full Body Skin Exam Lentigo nose used Hydroquinone 4%  prn, hx of Aks. She has a couple itchy growths on her neck and thigh.  The patient presents for Total-Body Skin Exam (TBSE) for skin cancer screening and mole check. The patient has spots, moles and lesions to be evaluated, some may be new or changing and the patient may have concern these could be cancer.    The following portions of the chart were reviewed this encounter and updated as appropriate: medications, allergies, medical history  Review of Systems:  No other skin or systemic complaints except as noted in HPI or Assessment and Plan.  Objective  Well appearing patient in no apparent distress; mood and affect are within normal limits.  A full examination was performed including scalp, head, eyes, ears, nose, lips, neck, chest, axillae, abdomen, back, buttocks, bilateral upper extremities, bilateral lower extremities, hands, feet, fingers, toes, fingernails, and toenails. All findings within normal limits unless otherwise noted below.   Relevant physical exam findings are noted in the Assessment and Plan.  L post lower neck x 1, L upper ant thigh x 1 (2) Stuck on waxy paps with erythema    Assessment & Plan   SKIN CANCER SCREENING PERFORMED TODAY.  ACTINIC DAMAGE - Chronic condition, secondary to cumulative UV/sun exposure - diffuse scaly erythematous macules with underlying dyspigmentation - Recommend daily broad spectrum sunscreen SPF 30+ to sun-exposed areas, reapply every 2 hours as needed.  - Staying in the shade or wearing long sleeves, sun glasses (UVA+UVB protection) and wide brim hats (4-inch brim around the entire circumference of the hat) are also recommended for sun protection.  - Call for new or changing lesions.  LENTIGINES, SEBORRHEIC KERATOSES, HEMANGIOMAS - Benign normal skin  lesions - Benign-appearing - Call for any changes  MELANOCYTIC NEVI - Tan-brown and/or pink-flesh-colored symmetric macules and papules -R medial buttock 4.60mm med brown pap darker center -R abdomen 3.22mm med dark brown macule -L upper abdomen 5.0 x 3.55mm med brown macule -L inguinal crease 4.0 x 2.32mm dark brown macule -L lower ear helix 3.62mm light brown macule -L ant neck 4.0 mm flesh papule -R gluteal cleft fleshy pap hx of irritation  - Benign appearing on exam today, stable. - Observation - Call clinic for new or changing moles - Recommend daily use of broad spectrum spf 30+ sunscreen to sun-exposed areas.  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 Stable compared to previous visit. Observation.  Call clinic for new or changing moles.  Recommend daily use of broad spectrum spf 30+ sunscreen to sun-exposed areas.    LENTIGO Tip of nose Exam: 6.76mm light brown macule with some fading when compared to photo Due to sun exposure  Treatment Plan: Benign-appearing, improved from Hydroquinone 4% Restart Hydroquinone 4% cream every day/bid aa nose for up to 3 months Recommend SPF daily  Reviewed risk of permanent dark spots (ochronosis) if hydroquinone is used longer than 3 months.    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 Currently well-controlled.   Herpes Simplex Virus = Cold Sores = Fever Blisters is a chronic recurring blistering; scabbing sore-producing viral infection that is recurrent usually in the same area triggered by stress, sun/UV exposure and trauma.  It is infectious  and can be spread from person to person by direct contact.  It is not curable, but is treatable with topical and oral medication.  Treatment Plan Cont Valacyclovir 1 gram 2 po at onset of symptoms then 2 po 12 hours later prn flares    HISTORY OF  PRECANCEROUS ACTINIC KERATOSIS - site(s) of PreCancerous Actinic Keratosis clear today. 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  Chronic and persistent condition with duration or expected duration over one year. Condition is symptomatic/ bothersome to patient. Not currently at goal.  Treatment Plan: Start Jublia sol qhs to L great toenail    Inflamed seborrheic keratosis (2) L post lower neck x 1, L upper ant thigh x 1  Symptomatic, irritating, patient would like treated.  Destruction of lesion - L post lower neck x 1, L upper ant thigh x 1 (2)  Destruction method: cryotherapy   Informed consent: discussed and consent obtained   Lesion destroyed using liquid nitrogen: Yes   Region frozen until ice ball extended beyond lesion: Yes   Outcome: patient tolerated procedure well with no complications   Post-procedure details: wound care instructions given   Additional details:  Prior to procedure, discussed risks of blister formation, small wound, skin dyspigmentation, or rare scar following cryotherapy. Recommend Vaseline ointment to treated areas while healing.    Return in about 6 months (around 03/20/2024) for spot check, 1 yr TBSE hx of AKs.  I, Ardis Rowan, RMA, am acting as scribe for Willeen Niece, MD .   Documentation: I have reviewed the above documentation for accuracy and completeness, and I agree with the above.  Willeen Niece, MD

## 2024-01-13 ENCOUNTER — Other Ambulatory Visit: Payer: Self-pay | Admitting: Obstetrics and Gynecology

## 2024-01-13 DIAGNOSIS — Z1231 Encounter for screening mammogram for malignant neoplasm of breast: Secondary | ICD-10-CM

## 2024-03-15 ENCOUNTER — Encounter: Payer: Self-pay | Admitting: Dermatology

## 2024-03-15 ENCOUNTER — Ambulatory Visit: Admitting: Dermatology

## 2024-03-15 DIAGNOSIS — L814 Other melanin hyperpigmentation: Secondary | ICD-10-CM | POA: Diagnosis not present

## 2024-03-15 DIAGNOSIS — L820 Inflamed seborrheic keratosis: Secondary | ICD-10-CM

## 2024-03-15 DIAGNOSIS — D2271 Melanocytic nevi of right lower limb, including hip: Secondary | ICD-10-CM | POA: Diagnosis not present

## 2024-03-15 DIAGNOSIS — D2272 Melanocytic nevi of left lower limb, including hip: Secondary | ICD-10-CM

## 2024-03-15 DIAGNOSIS — L821 Other seborrheic keratosis: Secondary | ICD-10-CM

## 2024-03-15 DIAGNOSIS — D229 Melanocytic nevi, unspecified: Secondary | ICD-10-CM

## 2024-03-15 NOTE — Progress Notes (Signed)
   Follow-Up Visit   Subjective  CELENA RODBERG is a 78 y.o. female who presents for the following: scattered spots on body. Some areas are itching, rough, scaly.  The patient has spots, moles and lesions to be evaluated, some may be new or changing and the patient may have concern these could be cancer.   The following portions of the chart were reviewed this encounter and updated as appropriate: medications, allergies, medical history  Review of Systems:  No other skin or systemic complaints except as noted in HPI or Assessment and Plan.  Objective  Well appearing patient in no apparent distress; mood and affect are within normal limits.  A focused examination was performed of the following areas: Face, back, arms, legs, chest  Relevant exam findings are noted in the Assessment and Plan.  sacrum x2, R buttock x1, R axilla x1, R upper abdomen x1, intermammary x1, L calf x2, L pretibia x1, L med lower leg x1, right angle of jaw x1, L upper arm x1 (12) Erythematous keratotic or waxy stuck-on papule  Assessment & Plan   LENTIGINES Exam: scattered tan macules Due to sun exposure Treatment Plan: Benign-appearing, observe. Recommend daily broad spectrum sunscreen SPF 30+ to sun-exposed areas, reapply every 2 hours as needed.  Call for any changes  SEBORRHEIC KERATOSIS - Stuck-on, waxy, tan-brown papules and/or plaques at back, arms, face, legs - Benign-appearing - Discussed benign etiology and prognosis. - Observe - Call for any changes  MELANOCYTIC NEVI Exam: Tan-brown and/or pink-flesh-colored symmetric macules and papules at hips  Treatment Plan: Benign appearing on exam today. Recommend observation. Call clinic for new or changing moles. Recommend daily use of broad spectrum spf 30+ sunscreen to sun-exposed areas.   LENTIGO Tip of nose Exam: 6.38mm light brown macule with some fading when compared to photo Due to sun exposure   Treatment Plan: Benign-appearing,  improved from Hydroquinone  4% Can start Hydroquinone  4% cream every day/bid aa nose for up to 3 months, then take break Recommend SPF daily   Reviewed risk of permanent dark spots (ochronosis) if hydroquinone  is used longer than 3 months.    INFLAMED SEBORRHEIC KERATOSIS (12) sacrum x2, R buttock x1, R axilla x1, R upper abdomen x1, intermammary x1, L calf x2, L pretibia x1, L med lower leg x1, right angle of jaw x1, L upper arm x1 (12) Symptomatic, irritating, patient would like treated. Destruction of lesion - sacrum x2, R buttock x1, R axilla x1, R upper abdomen x1, intermammary x1, L calf x2, L pretibia x1, L med lower leg x1, right angle of jaw x1, L upper arm x1 (12)  Destruction method: cryotherapy   Informed consent: discussed and consent obtained   Lesion destroyed using liquid nitrogen: Yes   Region frozen until ice ball extended beyond lesion: Yes   Outcome: patient tolerated procedure well with no complications   Post-procedure details: wound care instructions given   Additional details:  Prior to procedure, discussed risks of blister formation, small wound, skin dyspigmentation, or rare scar following cryotherapy. Recommend Vaseline ointment to treated areas while healing.   Return for TBSE As Scheduled.  I, Jill Parcell, CMA, am acting as scribe for Artemio Larry, MD.   Documentation: I have reviewed the above documentation for accuracy and completeness, and I agree with the above.  Artemio Larry, MD

## 2024-03-15 NOTE — Patient Instructions (Addendum)
 Cryotherapy Aftercare  Wash gently with soap and water everyday.   Apply Vaseline and Band-Aid daily until healed.    Use Hydroquinone  4% cream once or twice a day to affected area on  nose for up to 3 months Recommend SPF daily  Reviewed risk of permanent dark spots (ochronosis) if hydroquinone  is used longer than 3 months.    Recommend daily broad spectrum sunscreen SPF 30+ to sun-exposed areas, reapply every 2 hours as needed. Call for new or changing lesions.  Staying in the shade or wearing long sleeves, sun glasses (UVA+UVB protection) and wide brim hats (4-inch brim around the entire circumference of the hat) are also recommended for sun protection.     Seborrheic Keratosis  What causes seborrheic keratoses? Seborrheic keratoses are harmless, common skin growths that first appear during adult life.  As time goes by, more growths appear.  Some people may develop a large number of them.  Seborrheic keratoses appear on both covered and uncovered body parts.  They are not caused by sunlight.  The tendency to develop seborrheic keratoses can be inherited.  They vary in color from skin-colored to gray, brown, or even black.  They can be either smooth or have a rough, warty surface.   Seborrheic keratoses are superficial and look as if they were stuck on the skin.  Under the microscope this type of keratosis looks like layers upon layers of skin.  That is why at times the top layer may seem to fall off, but the rest of the growth remains and re-grows.    Treatment Seborrheic keratoses do not need to be treated, but can easily be removed in the office.  Seborrheic keratoses often cause symptoms when they rub on clothing or jewelry.  Lesions can be in the way of shaving.  If they become inflamed, they can cause itching, soreness, or burning.  Removal of a seborrheic keratosis can be accomplished by freezing, burning, or surgery. If any spot bleeds, scabs, or grows rapidly, please return to have  it checked, as these can be an indication of a skin cancer.    Due to recent changes in healthcare laws, you may see results of your pathology and/or laboratory studies on MyChart before the doctors have had a chance to review them. We understand that in some cases there may be results that are confusing or concerning to you. Please understand that not all results are received at the same time and often the doctors may need to interpret multiple results in order to provide you with the best plan of care or course of treatment. Therefore, we ask that you please give us  2 business days to thoroughly review all your results before contacting the office for clarification. Should we see a critical lab result, you will be contacted sooner.   If You Need Anything After Your Visit  If you have any questions or concerns for your doctor, please call our main line at [phone] and press option 4 to reach your doctor's medical assistant. If no one answers, please leave a voicemail as directed and we will return your call as soon as possible. Messages left after 4 pm will be answered the following business day.   You may also send us  a message via MyChart. We typically respond to MyChart messages within 1-2 business days.  For prescription refills, please ask your pharmacy to contact our office. Our fax number is [phone].  If you have an urgent issue when the clinic is closed  that cannot wait until the next business day, you can page your doctor at the number below.    Please note that while we do our best to be available for urgent issues outside of office hours, we are not available 24/7.   If you have an urgent issue and are unable to reach us , you may choose to seek medical care at your doctor's office, retail clinic, urgent care center, or emergency room.  If you have a medical emergency, please immediately call 911 or go to the emergency department.  Pager Numbers  - Dr. Bary Likes:  [phone]  - Dr. Annette Barters: [phone]  - Dr. Felipe Horton: [phone]   In the event of inclement weather, please call our main line at [phone] for an update on the status of any delays or closures.  Dermatology Medication Tips: Please keep the boxes that topical medications come in in order to help keep track of the instructions about where and how to use these. Pharmacies typically print the medication instructions only on the boxes and not directly on the medication tubes.   If your medication is too expensive, please contact our office at [phone] option 4 or send us  a message through MyChart.   We are unable to tell what your co-pay for medications will be in advance as this is different depending on your insurance coverage. However, we may be able to find a substitute medication at lower cost or fill out paperwork to get insurance to cover a needed medication.   If a prior authorization is required to get your medication covered by your insurance company, please allow us  1-2 business days to complete this process.  Drug prices often vary depending on where the prescription is filled and some pharmacies may offer cheaper prices.  The website www.goodrx.com contains coupons for medications through different pharmacies. The prices here do not account for what the cost may be with help from insurance (it may be cheaper with your insurance), but the website can give you the price if you did not use any insurance.  - You can print the associated coupon and take it with your prescription to the pharmacy.  - You may also stop by our office during regular business hours and pick up a GoodRx coupon card.  - If you need your prescription sent electronically to a different pharmacy, notify our office through [facility] or by phone at [phone] option 4.     Si Usted Necesita Algo Despus de Su Visita  Tambin puede enviarnos un mensaje a travs de Clinical cytogeneticist. Por lo general  respondemos a los mensajes de MyChart en el transcurso de 1 a 2 das hbiles.  Para renovar recetas, por favor pida a su farmacia que se ponga en contacto con nuestra oficina. Franz Jacks de fax es [place] [phone].  Si tiene un asunto urgente cuando la clnica est cerrada y que no puede esperar hasta el siguiente da hbil, puede llamar/localizar a su doctor(a) al nmero que aparece a continuacin.   Por favor, tenga en cuenta que aunque hacemos todo lo posible para estar disponibles para asuntos urgentes fuera del horario de [place], no estamos disponibles las 24 horas del da, los 7 [address]  [address] de la [place].   Si tiene un problema urgente y no puede comunicarse con nosotros, puede optar por buscar atencin mdica  en el consultorio de su doctor(a), en una clnica privada, en un centro de atencin urgente o en una sala de emergencias.  Si tiene [employer],  por favor llame inmediatamente al 911 o vaya a la sala de emergencias.  Nmeros de bper  - Dr. Bary Likes: [phone]  - Dra. Annette Barters: [phone]  - Dr. Felipe Horton: [phone]   En caso de inclemencias del tiempo, por favor llame a Lajuan Pila principal al [phone] para una actualizacin sobre el [place] de cualquier retraso o cierre.  Consejos para la medicacin en dermatologa: Por favor, guarde las cajas en las que vienen los medicamentos de uso tpico para ayudarle a seguir las instrucciones sobre dnde y cmo usarlos. Las farmacias generalmente imprimen las instrucciones del medicamento slo en las cajas y no directamente en los tubos del [place].   Si su medicamento es muy caro, por favor, pngase en contacto con Bettyjane Brunet llamando al [phone] y presione la opcin 4 o envenos un mensaje a travs de Clinical cytogeneticist.   No podemos decirle cul ser su copago por los medicamentos por adelantado ya que esto es diferente dependiendo de la cobertura de su seguro. Sin embargo, es posible que podamos encontrar un  medicamento sustituto a Audiological scientist un formulario para que el seguro cubra el medicamento que se considera necesario.   Si se requiere una autorizacin previa para que su compaa de seguros [place] su medicamento, por favor permtanos de 1 a 2 das hbiles para completar este proceso.  Los precios de los medicamentos varan con frecuencia dependiendo del Environmental consultant de dnde se surte la receta y alguna farmacias pueden ofrecer precios ms baratos.  El sitio web www.goodrx.com tiene cupones para medicamentos de Health and safety inspector. Los precios aqu no tienen en cuenta lo que podra costar con la ayuda del seguro (puede ser ms barato con su seguro), pero el sitio web puede darle el precio si no utiliz Tourist information centre manager.  - Puede imprimir el cupn correspondiente y llevarlo con su receta a la farmacia.  - Tambin puede pasar por nuestra oficina durante el horario de atencin regular y Education officer, museum una tarjeta de cupones de GoodRx.  - Si necesita que su receta se enve electrnicamente a una farmacia diferente, informe a nuestra oficina a travs de MyChart de [place] o por telfono llamando al [phone] y presione la opcin 4.

## 2024-03-21 ENCOUNTER — Ambulatory Visit: Payer: Medicare Other | Admitting: Dermatology

## 2024-08-19 ENCOUNTER — Encounter

## 2024-09-06 ENCOUNTER — Ambulatory Visit
Admission: RE | Admit: 2024-09-06 | Discharge: 2024-09-06 | Disposition: A | Discharge status: Home / Self Care | Source: Ambulatory Visit | Attending: Obstetrics and Gynecology | Admitting: Obstetrics and Gynecology

## 2024-09-06 DIAGNOSIS — Z1231 Encounter for screening mammogram for malignant neoplasm of breast: Secondary | ICD-10-CM | POA: Insufficient documentation

## 2024-09-26 ENCOUNTER — Encounter: Payer: Medicare Other | Admitting: Dermatology

## 2024-09-28 ENCOUNTER — Ambulatory Visit: Admitting: Dermatology

## 2024-09-28 DIAGNOSIS — D1801 Hemangioma of skin and subcutaneous tissue: Secondary | ICD-10-CM

## 2024-09-28 DIAGNOSIS — Z1283 Encounter for screening for malignant neoplasm of skin: Secondary | ICD-10-CM

## 2024-09-28 DIAGNOSIS — L820 Inflamed seborrheic keratosis: Secondary | ICD-10-CM | POA: Diagnosis not present

## 2024-09-28 DIAGNOSIS — D224 Melanocytic nevi of scalp and neck: Secondary | ICD-10-CM

## 2024-09-28 DIAGNOSIS — B351 Tinea unguium: Secondary | ICD-10-CM

## 2024-09-28 DIAGNOSIS — B009 Herpesviral infection, unspecified: Secondary | ICD-10-CM

## 2024-09-28 DIAGNOSIS — L814 Other melanin hyperpigmentation: Secondary | ICD-10-CM

## 2024-09-28 DIAGNOSIS — W908XXA Exposure to other nonionizing radiation, initial encounter: Secondary | ICD-10-CM

## 2024-09-28 DIAGNOSIS — D2222 Melanocytic nevi of left ear and external auricular canal: Secondary | ICD-10-CM

## 2024-09-28 DIAGNOSIS — D229 Melanocytic nevi, unspecified: Secondary | ICD-10-CM

## 2024-09-28 DIAGNOSIS — L821 Other seborrheic keratosis: Secondary | ICD-10-CM

## 2024-09-28 DIAGNOSIS — L578 Other skin changes due to chronic exposure to nonionizing radiation: Secondary | ICD-10-CM | POA: Diagnosis not present

## 2024-09-28 DIAGNOSIS — D225 Melanocytic nevi of trunk: Secondary | ICD-10-CM

## 2024-09-28 DIAGNOSIS — Z872 Personal history of diseases of the skin and subcutaneous tissue: Secondary | ICD-10-CM

## 2024-09-28 DIAGNOSIS — Q825 Congenital non-neoplastic nevus: Secondary | ICD-10-CM

## 2024-09-28 DIAGNOSIS — L84 Corns and callosities: Secondary | ICD-10-CM

## 2024-09-28 MED ORDER — HYDROQUINONE 4 % EX CREA: TOPICAL_CREAM | CUTANEOUS | 2 refills | Status: AC

## 2024-09-28 MED ORDER — JUBLIA 10 % EX SOLN: 1.0000 | Freq: Every evening | CUTANEOUS | 11 refills | Status: AC

## 2024-09-28 MED ORDER — VALACYCLOVIR HCL 1 G PO TABS: 1000.0000 mg | ORAL_TABLET | ORAL | 11 refills | Status: AC

## 2024-09-28 NOTE — Progress Notes (Signed)
 Follow-Up Visit   Subjective  Ashley Parrish is a 78 y.o. female who presents for the following: Skin Cancer Screening and Full Body Skin Exam hx of Aks, check spot L arm just noticed, check itchy area L calf, check spot ant neck, check spot back, recheck lentigo on nose, used Hydroquinone  4% on nose x 49m  The patient presents for Total-Body Skin Exam (TBSE) for skin cancer screening and mole check. The patient has spots, moles and lesions to be evaluated, some may be new or changing and the patient may have concern these could be cancer.    The following portions of the chart were reviewed this encounter and updated as appropriate: medications, allergies, medical history  Review of Systems:  No other skin or systemic complaints except as noted in HPI or Assessment and Plan.  Objective  Well appearing patient in no apparent distress; mood and affect are within normal limits.  A full examination was performed including scalp, head, eyes, ears, nose, lips, neck, chest, axillae, abdomen, back, buttocks, bilateral upper extremities, bilateral lower extremities, hands, feet, fingers, toes, fingernails, and toenails. All findings within normal limits unless otherwise noted below.   Relevant physical exam findings are noted in the Assessment and Plan.  spinal mid back x 1, R low back x 4, ant neck x 1, L forearm x 1, L upper popliteal x 1, L lat calf x 1, R ant ankle x 1 Stuck on waxy papules with erythema  Assessment & Plan   SKIN CANCER SCREENING PERFORMED TODAY.  ACTINIC DAMAGE - Chronic condition, secondary to cumulative UV/sun exposure - diffuse scaly erythematous macules with underlying dyspigmentation - Recommend daily broad spectrum sunscreen SPF 30+ to sun-exposed areas, reapply every 2 hours as needed.  - Staying in the shade or wearing long sleeves, sun glasses (UVA+UVB protection) and wide brim hats (4-inch brim around the entire circumference of the hat) are also recommended  for sun protection.  - Call for new or changing lesions.  LENTIGINES, SEBORRHEIC KERATOSES, HEMANGIOMAS - Benign normal skin lesions - Benign-appearing - Call for any changes  MELANOCYTIC NEVI - Tan-brown and/or pink-flesh-colored symmetric macules and papules -R medial upper buttock 4 mm med brown pap darker center -R abdomen 3.5 mm med dark brown macule -L upper abdomen 5 x 3 mm med brown macule -L inguinal crease 4 x 2 mm dark brown macule -L lower ear helix 3 mm light brown macule -L ant neck 4 mm flesh papule -R gluteal cleft 1.0 cm fleshy papule - Benign appearing on exam today, Stable - Observation - Call clinic for new or changing moles - Recommend daily use of broad spectrum spf 30+ sunscreen to sun-exposed areas.    HISTORY OF PRECANCEROUS ACTINIC KERATOSIS - site(s) of PreCancerous Actinic Keratosis clear today. - these may recur and new lesions may form requiring treatment to prevent transformation into skin cancer - observe for new or changing spots and contact [facility] for appointment if occur - photoprotection with sun protective clothing; sunglasses and broad spectrum sunscreen with SPF of at least 30 + and frequent self skin exams recommended - yearly exams by a dermatologist recommended for persons with history of PreCancerous Actinic Keratoses   CONGENITAL NEVUS L lower hip Exam: 1.4 x 1.0cm brown flat papule   Treatment Plan: Benign-appearing. Stable compared to previous visit. Observation.  Call clinic for new or changing moles.  Recommend daily use of broad spectrum spf 30+ sunscreen to sun-exposed areas.   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 Currently well-controlled.   Herpes Simplex Virus = Cold Sores = Fever Blisters is a chronic recurring blistering; scabbing sore-producing viral infection that is recurrent usually in the same area triggered by  stress, sun/UV exposure and trauma.  It is infectious and can be spread from person to person by direct contact.  It is not curable, but is treatable with topical and oral medication.   Treatment Plan Cont Valacyclovir  1 gram 2 po at onset of symptoms then 2 po 12 hours later prn flares   LENTIGO Tip of nose Exam: 6 x 4 mm light brown macule with fading when compared to photo 09/08/22 Due to sun exposure   Treatment Plan: Benign-appearing, improved from Hydroquinone  4% Cont Hydroquinone  4% cr qhs 2 months on and 2 months off or 3 months on and 3 months off Cont SPF qd  Reviewed risk of permanent dark spots (ochronosis) if hydroquinone  is used longer than 3 months.   CALLUS R medial great toe Exam: firm hyperkeratotic papule with intact skin lines c/w callus R medial great toe  Treatment Plan: Recommend using Curad Mediplast pads. Cut to fit wart or callus. Cover with Elastoplast waterproof tape or any waterproof band-aid. Change every 3 to 4 days, or sooner if necessary.  Treatment may require several months of regular use before results are seen.   ONYCHOMYCOSIS L great toenail Exam: Exam of nails limited by presence of nail polish. Pt states has discoloration, previous photo c/w onychomycosis  Chronic and persistent condition with duration or expected duration over one year. Condition is symptomatic/ bothersome to patient. Not currently at goal. Never got Rx last year.  Treatment Plan: Benign-appearing.  Observation Start [place]ubl[place]  qhs to affected toenail, if not covered will send in Kerydin 5% to [employer] with Good[facility]  INFLAMED SEBORRHEIC KERATOSIS spinal mid back x 1, R low back x 4, ant neck x 1, L forearm x 1, L upper popliteal x 1, L lat calf x 1, R ant ankle x 1 Symptomatic, irritating, patient would like treated. Destruction of lesion - spinal mid back x 1, R low back x 4, ant neck x 1, L forearm x 1, L upper popliteal x 1, L lat calf x 1, R ant ankle x 1  Destruction  method: cryotherapy   Informed consent: discussed and consent obtained   Lesion destroyed using liquid nitrogen: Yes   Region frozen until ice ball extended beyond lesion: Yes   Outcome: patient tolerated procedure well with no complications   Post-procedure details: wound care instructions given   Additional details:  Prior to procedure, discussed risks of blister formation, small wound, skin dyspigmentation, or rare scar following cryotherapy. Recommend Vaseline ointment to treated areas while healing.   Return in about 1 year (around 09/28/2025) for TBSE, Hx of AKs.  I, Grayce Saunas, RMA, am acting as scribe for Rexene Rattler, MD .   Documentation: I have reviewed the above documentation for accuracy and completeness, and I agree with the above.  Rexene Rattler, MD

## 2024-09-28 NOTE — Patient Instructions (Addendum)
 Lentigo on nose Cont Hydroquinone  4% cream nightly for 2 months on and then 2 months off medication or 3 months on medication and then 3 months off medication Continue SPF daily  Calluses Start otc Curad medi patches if irritating  Cryotherapy Aftercare  Wash gently with soap and water everyday.   Apply Vaseline and Band-Aid daily until healed.   Due to recent changes in healthcare laws, you may see results of your pathology and/or laboratory studies on MyChart before the doctors have had a chance to review them. We understand that in some cases there may be results that are confusing or concerning to you. Please understand that not all results are received at the same time and often the doctors may need to interpret multiple results in order to provide you with the best plan of care or course of treatment. Therefore, we ask that you please give us  2 business days to thoroughly review all your results before contacting the office for clarification. Should we see a critical lab result, you will be contacted sooner.   If You Need Anything After Your Visit  If you have any questions or concerns for your doctor, please call our main line at [phone] and press option 4 to reach your doctor's medical assistant. If no one answers, please leave a voicemail as directed and we will return your call as soon as possible. Messages left after 4 pm will be answered the following business day.   You may also send us  a message via MyChart. We typically respond to MyChart messages within 1-2 business days.  For prescription refills, please ask your pharmacy to contact our office. Our fax number is [phone].  If you have an urgent issue when the clinic is closed that cannot wait until the next business day, you can page your doctor at the number below.    Please note that while we do our best to be available for urgent issues outside of office hours, we are not available 24/7.   If you have an urgent  issue and are unable to reach us , you may choose to seek medical care at your doctor's office, retail clinic, urgent care center, or emergency room.  If you have a medical emergency, please immediately call 911 or go to the emergency department.  Pager Numbers  - Dr. Hester: [phone]  - Dr. Jackquline: [phone]  - Dr. Claudene: [phone]   - Dr. Raymund: [phone]  In the event of inclement weather, please call our main line at [phone] for an update on the status of any delays or closures.  Dermatology Medication Tips: Please keep the boxes that topical medications come in in order to help keep track of the instructions about where and how to use these. Pharmacies typically print the medication instructions only on the boxes and not directly on the medication tubes.   If your medication is too expensive, please contact our office at [phone] option 4 or send us  a message through MyChart.   We are unable to tell what your co-pay for medications will be in advance as this is different depending on your insurance coverage. However, we may be able to find a substitute medication at lower cost or fill out paperwork to get insurance to cover a needed medication.   If a prior authorization is required to get your medication covered by your insurance company, please allow us  1-2 business days to complete this process.  Drug prices often vary depending on where the prescription is  filled and some pharmacies may offer cheaper prices.  The website www.goodrx.com contains coupons for medications through different pharmacies. The prices here do not account for what the cost may be with help from insurance (it may be cheaper with your insurance), but the website can give you the price if you did not use any insurance.  - You can print the associated coupon and take it with your prescription to the pharmacy.  - You may also stop by our office during regular business hours and pick up a  GoodRx coupon card.  - If you need your prescription sent electronically to a different pharmacy, notify our office through [facility] or by phone at [phone] option 4.     Si Usted Necesita Algo Despus de Su Visita  Tambin puede enviarnos un mensaje a travs de Clinical Cytogeneticist. Por lo general respondemos a los mensajes de MyChart en el transcurso de 1 a 2 das hbiles.  Para renovar recetas, por favor pida a su farmacia que se ponga en contacto con nuestra oficina. Randi lakes de fax es [place] [phone].  Si tiene un asunto urgente cuando la clnica est cerrada y que no puede esperar hasta el siguiente da hbil, puede llamar/localizar a su doctor(a) al nmero que aparece a continuacin.   Por favor, tenga en cuenta que aunque hacemos todo lo posible para estar disponibles para asuntos urgentes fuera del horario de [place], no estamos disponibles las 24 horas del da, los 7 [address]  [address] de la [place].   Si tiene un problema urgente y no puede comunicarse con nosotros, puede optar por buscar atencin mdica  en el consultorio de su doctor(a), en una clnica privada, en un centro de atencin urgente o en una sala de emergencias.  Si tiene engineer, drilling, por favor llame inmediatamente al 911 o vaya a la sala de emergencias.  Nmeros de bper  - Dr. Hester: [phone]  - Dra. Jackquline: [phone]  - Dr. Claudene: [phone]  - Dra. Kitts: [phone]  En caso de inclemencias del [place], por favor llame a nuestra lnea principal al [phone] para una actualizacin sobre el estado de cualquier retraso o cierre.  Consejos para la medicacin en dermatologa: Por favor, guarde las cajas en las que vienen los medicamentos de uso tpico para ayudarle a seguir las instrucciones sobre dnde y cmo usarlos. Las farmacias generalmente imprimen las instrucciones del medicamento slo en las cajas y no directamente en los tubos del [place].   Si su medicamento es muy caro, por  favor, pngase en contacto con landry rieger llamando al [phone] y presione la opcin 4 o envenos un mensaje a travs de Clinical Cytogeneticist.   No podemos decirle cul ser su copago por los medicamentos por adelantado ya que esto es diferente dependiendo de la cobertura de su seguro. Sin embargo, es posible que podamos encontrar un medicamento sustituto a audiological scientist un formulario para que el seguro cubra el medicamento que se considera necesario.   Si se requiere una autorizacin previa para que su compaa de seguros cubra su medicamento, por favor permtanos de 1 a 2 das hbiles para completar este proceso.  Los precios de los medicamentos varan con frecuencia dependiendo del environmental consultant de dnde se surte la receta y alguna farmacias pueden ofrecer precios ms baratos.  El sitio web www.goodrx.com tiene cupones para medicamentos de health and safety inspector. Los precios aqu no tienen en cuenta lo que podra costar con la ayuda del seguro (puede ser ms barato con su seguro), [employer]  el sitio web puede darle el precio si no visual merchandiser.  - Puede imprimir el cupn correspondiente y llevarlo con su receta a la farmacia.  - Tambin puede pasar por nuestra oficina durante el horario de atencin regular y education officer, museum una tarjeta de cupones de GoodRx.  - Si necesita que su receta se enve electrnicamente a una farmacia diferente, informe a nuestra oficina a travs de MyChart de [place] o por telfono llamando al [phone] y presione la opcin 4.

## 2024-12-28 ENCOUNTER — Ambulatory Visit: Admitting: Dermatology

## 2025-10-02 ENCOUNTER — Encounter: Admitting: Dermatology
